# Patient Record
Sex: Male | Born: 1950 | Race: White | Hispanic: No | Smoking: Current every day smoker
Health system: Southern US, Community
[De-identification: ages and names within clinical notes are randomized; demographics above are authoritative.]

## PROBLEM LIST (undated history)

## (undated) DIAGNOSIS — M1990 Unspecified osteoarthritis, unspecified site: Secondary | ICD-10-CM

## (undated) DIAGNOSIS — S52501A Unspecified fracture of the lower end of right radius, initial encounter for closed fracture: Secondary | ICD-10-CM

## (undated) DIAGNOSIS — W3400XA Accidental discharge from unspecified firearms or gun, initial encounter: Secondary | ICD-10-CM

## (undated) DIAGNOSIS — E039 Hypothyroidism, unspecified: Secondary | ICD-10-CM

## (undated) DIAGNOSIS — C61 Malignant neoplasm of prostate: Secondary | ICD-10-CM

## (undated) DIAGNOSIS — H548 Legal blindness, as defined in USA: Secondary | ICD-10-CM

## (undated) DIAGNOSIS — Z972 Presence of dental prosthetic device (complete) (partial): Secondary | ICD-10-CM

## (undated) DIAGNOSIS — K4090 Unilateral inguinal hernia, without obstruction or gangrene, not specified as recurrent: Secondary | ICD-10-CM

## (undated) DIAGNOSIS — K08109 Complete loss of teeth, unspecified cause, unspecified class: Secondary | ICD-10-CM

## (undated) HISTORY — PX: PROSTATE BIOPSY: SHX241

## (undated) HISTORY — PX: EYE SURGERY: SHX253

## (undated) HISTORY — PX: OTHER SURGICAL HISTORY: SHX169

## (undated) HISTORY — PX: NO PAST SURGERIES: SHX2092

## (undated) HISTORY — DX: Hypothyroidism, unspecified: E03.9

## (undated) HISTORY — DX: Legal blindness, as defined in USA: H54.8

## (undated) HISTORY — PX: MULTIPLE TOOTH EXTRACTIONS: SHX2053

## (undated) HISTORY — DX: Unspecified osteoarthritis, unspecified site: M19.90

---

## 1978-07-07 DIAGNOSIS — H548 Legal blindness, as defined in USA: Secondary | ICD-10-CM

## 1978-07-07 HISTORY — DX: Legal blindness, as defined in USA: H54.8

## 2007-07-12 ENCOUNTER — Emergency Department (HOSPITAL_COMMUNITY): Admission: EM | Admit: 2007-07-12 | Discharge: 2007-07-12 | Payer: Self-pay | Admitting: Emergency Medicine

## 2011-09-04 ENCOUNTER — Telehealth: Payer: Self-pay | Admitting: Internal Medicine

## 2011-09-04 NOTE — Telephone Encounter (Signed)
Faith called and is hoping her husband can be seen as a new patient.  She states he is having trouble sleeping.  She hopes they can both come together on 3/14 (thats when her next apt is).  She is scheduled at 10am, so she is hoping he can be seen at 10:15am.  You already have one new patient scheduled for that morning.    Please advise if an exception can be made.  Thanks!

## 2011-09-05 NOTE — Telephone Encounter (Signed)
ok 

## 2011-09-18 ENCOUNTER — Encounter: Payer: Self-pay | Admitting: Internal Medicine

## 2011-09-18 ENCOUNTER — Ambulatory Visit (INDEPENDENT_AMBULATORY_CARE_PROVIDER_SITE_OTHER): Payer: Commercial Managed Care - PPO | Admitting: Internal Medicine

## 2011-09-18 ENCOUNTER — Other Ambulatory Visit (INDEPENDENT_AMBULATORY_CARE_PROVIDER_SITE_OTHER): Payer: Commercial Managed Care - PPO

## 2011-09-18 VITALS — BP 130/72 | HR 62 | Temp 97.4°F | Ht 67.0 in | Wt 141.2 lb

## 2011-09-18 DIAGNOSIS — Z0000 Encounter for general adult medical examination without abnormal findings: Secondary | ICD-10-CM

## 2011-09-18 DIAGNOSIS — G5760 Lesion of plantar nerve, unspecified lower limb: Secondary | ICD-10-CM

## 2011-09-18 DIAGNOSIS — Z1211 Encounter for screening for malignant neoplasm of colon: Secondary | ICD-10-CM

## 2011-09-18 DIAGNOSIS — E039 Hypothyroidism, unspecified: Secondary | ICD-10-CM

## 2011-09-18 LAB — URINALYSIS
Ketones, ur: NEGATIVE
Leukocytes, UA: NEGATIVE
Specific Gravity, Urine: 1.025 (ref 1.000–1.030)
Urobilinogen, UA: 0.2 (ref 0.0–1.0)

## 2011-09-18 LAB — CBC WITH DIFFERENTIAL/PLATELET
Basophils Absolute: 0.1 10*3/uL (ref 0.0–0.1)
Eosinophils Absolute: 0.4 10*3/uL (ref 0.0–0.7)
Lymphocytes Relative: 32.3 % (ref 12.0–46.0)
MCHC: 33.8 g/dL (ref 30.0–36.0)
Neutro Abs: 4.4 10*3/uL (ref 1.4–7.7)
Neutrophils Relative %: 52.1 % (ref 43.0–77.0)
RDW: 12.8 % (ref 11.5–14.6)

## 2011-09-18 LAB — LIPID PANEL
Total CHOL/HDL Ratio: 5
Triglycerides: 163 mg/dL — ABNORMAL HIGH (ref 0.0–149.0)

## 2011-09-18 LAB — HEPATIC FUNCTION PANEL
AST: 15 U/L (ref 0–37)
Alkaline Phosphatase: 71 U/L (ref 39–117)
Total Bilirubin: 0.3 mg/dL (ref 0.3–1.2)

## 2011-09-18 LAB — BASIC METABOLIC PANEL
CO2: 28 mEq/L (ref 19–32)
Calcium: 9.4 mg/dL (ref 8.4–10.5)
Creatinine, Ser: 0.9 mg/dL (ref 0.4–1.5)
Glucose, Bld: 93 mg/dL (ref 70–99)

## 2011-09-18 LAB — TSH: TSH: 7.16 u[IU]/mL — ABNORMAL HIGH (ref 0.35–5.50)

## 2011-09-18 NOTE — Patient Instructions (Signed)
It was good to see you today. We have reviewed your medical history today Test(s) ordered today. Your results will be called to you after review (48-72hours after test completion). If any changes need to be made, you will be notified at that time. Health Maintenance reviewed - immunizations declined, refer to colonoscopy. we'll make referral to GI for colonoscopy and to Dr Darrick Penna for your toe/foot pain. Our office will contact you regarding appointment(s) once made. Please schedule followup in 1 year for physical and review, call sooner if problems.
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
## 2011-09-19 ENCOUNTER — Encounter: Payer: Self-pay | Admitting: Internal Medicine

## 2011-09-19 DIAGNOSIS — E039 Hypothyroidism, unspecified: Secondary | ICD-10-CM | POA: Insufficient documentation

## 2011-09-19 MED ORDER — LEVOTHYROXINE SODIUM 50 MCG PO TABS: 50.0000 ug | ORAL_TABLET | Freq: Every day | ORAL | Status: DC

## 2011-09-19 NOTE — Assessment & Plan Note (Signed)
New dx on CPX labs 09/2011 Start synth qd and recheck in 3-6 mo Lab Results  Component Value Date   TSH 7.16* 09/18/2011

## 2011-09-24 ENCOUNTER — Ambulatory Visit: Payer: Commercial Managed Care - PPO | Admitting: Sports Medicine

## 2011-10-07 ENCOUNTER — Ambulatory Visit (INDEPENDENT_AMBULATORY_CARE_PROVIDER_SITE_OTHER): Payer: Commercial Managed Care - PPO | Admitting: Sports Medicine

## 2011-10-07 VITALS — BP 120/60 | Ht 67.0 in | Wt 141.0 lb

## 2011-10-07 DIAGNOSIS — M7750 Other enthesopathy of unspecified foot: Secondary | ICD-10-CM

## 2011-10-07 DIAGNOSIS — M7740 Metatarsalgia, unspecified foot: Secondary | ICD-10-CM | POA: Insufficient documentation

## 2011-10-07 NOTE — Patient Instructions (Signed)
It was great to see you today! If your pain is not better in 2-4 weeks, come back to see [place] again.

## 2011-10-07 NOTE — Progress Notes (Signed)
Patient ID: Tony Massey, male   DOB: 1950-09-26, 61 y.o.   MRN: [id number] Pt is a 61 year old male who presents today with several years worth of right forefoot pain and 3rd-5th right toe numbness with long standing.  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  He reports that his pain has increased over the last several months and he was referred to sports medicine after an initial appointment with his new PCP.  Pt is visually impaired and so is not able to comment on any swelling/erythema of the affected foot.  Objective: Gen: Pleasant gentleman, no distress Leg: Negative straight leg raise. Hand: Left hand dupuytren's contracture with 1st MCP arthritis, right hand with significant 1st-3rd MCP arthritis. Foot/ankle: Partial fusion of the 2nd and 3rd toe on the right with 4th hammer-toe on the right.  Partial fusion of the 2nd and 3rd toes on the left that is less severe than on the right.  Pt has significant right sided transverse arch collapse.  No pain/numbness with palpation between metatarsals on the dorsal side. Neg squeeze test Negative quarter test for neuroma  Assessment/Plan: 1) Metatarsalgia of the right foot.  Treat with metatarsal foot pad. 2) Dupuytren's contracture on the left, currently relatively asymptomatic.  Rec continued stretching to promote range of motion.

## 2011-10-07 NOTE — Assessment & Plan Note (Signed)
See assessment  We will try with MT pads only If this does not resolve sxs well we will consider additional orthotic support  If temp sports insoles and pads work well we will replace these in 4 mos

## 2011-10-15 ENCOUNTER — Telehealth: Payer: Self-pay

## 2011-10-15 NOTE — Telephone Encounter (Signed)
Pt's spouse advised 

## 2011-10-15 NOTE — Telephone Encounter (Signed)
Pt's spouse called stating pt has been experiencing a decrease in energy level. Spouse is requesting MD advisement on an OTC vitamin that may help

## 2011-10-15 NOTE — Telephone Encounter (Signed)
Any One a day mens formula type MVI may help - ROV if continued symptoms - thanks

## 2011-11-19 ENCOUNTER — Encounter: Payer: Self-pay | Admitting: Gastroenterology

## 2012-12-03 ENCOUNTER — Telehealth: Payer: Self-pay | Admitting: *Deleted

## 2012-12-03 NOTE — Telephone Encounter (Signed)
Left msg on vm husband has pull a muscle in his back. Requesting rx for tramadol to be call into his pharmacy,,,,lmb
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
## 2013-07-14 ENCOUNTER — Telehealth: Payer: Self-pay | Admitting: Internal Medicine

## 2013-07-14 NOTE — Telephone Encounter (Signed)
Pt wants to know if Dr. Asa Lente will fill out paperwork stating he is blind and needs to ride [employer].  He was last seen in March 2013.

## 2013-07-15 NOTE — Telephone Encounter (Signed)
LMOM to call back

## 2013-07-15 NOTE — Telephone Encounter (Signed)
Yes, I will complete Pt also needs OV scheduled for follow up/AWV thanks

## 2013-07-21 NOTE — Telephone Encounter (Signed)
lmom to call for an appt.

## 2013-08-03 NOTE — Telephone Encounter (Signed)
Pt did not return calls.

## 2015-11-21 ENCOUNTER — Emergency Department
Admission: EM | Admit: 2015-11-21 | Discharge: 2015-11-21 | Disposition: A | Discharge status: Home / Self Care | Payer: Commercial Managed Care - PPO | Attending: Emergency Medicine | Admitting: Emergency Medicine

## 2015-11-21 DIAGNOSIS — L03114 Cellulitis of left upper limb: Secondary | ICD-10-CM | POA: Insufficient documentation

## 2015-11-21 DIAGNOSIS — S50812A Abrasion of left forearm, initial encounter: Secondary | ICD-10-CM | POA: Diagnosis not present

## 2015-11-21 DIAGNOSIS — Y999 Unspecified external cause status: Secondary | ICD-10-CM | POA: Insufficient documentation

## 2015-11-21 DIAGNOSIS — R2232 Localized swelling, mass and lump, left upper limb: Secondary | ICD-10-CM | POA: Diagnosis present

## 2015-11-21 DIAGNOSIS — Y929 Unspecified place or not applicable: Secondary | ICD-10-CM | POA: Insufficient documentation

## 2015-11-21 DIAGNOSIS — Y939 Activity, unspecified: Secondary | ICD-10-CM | POA: Insufficient documentation

## 2015-11-21 DIAGNOSIS — Z23 Encounter for immunization: Secondary | ICD-10-CM | POA: Insufficient documentation

## 2015-11-21 DIAGNOSIS — E039 Hypothyroidism, unspecified: Secondary | ICD-10-CM | POA: Insufficient documentation

## 2015-11-21 DIAGNOSIS — J45909 Unspecified asthma, uncomplicated: Secondary | ICD-10-CM | POA: Insufficient documentation

## 2015-11-21 DIAGNOSIS — T148XXA Other injury of unspecified body region, initial encounter: Secondary | ICD-10-CM

## 2015-11-21 DIAGNOSIS — M1990 Unspecified osteoarthritis, unspecified site: Secondary | ICD-10-CM | POA: Insufficient documentation

## 2015-11-21 DIAGNOSIS — W278XXA Contact with other nonpowered hand tool, initial encounter: Secondary | ICD-10-CM | POA: Insufficient documentation

## 2015-11-21 DIAGNOSIS — F17210 Nicotine dependence, cigarettes, uncomplicated: Secondary | ICD-10-CM | POA: Diagnosis not present

## 2015-11-21 HISTORY — DX: Accidental discharge from unspecified firearms or gun, initial encounter: W34.00XA

## 2015-11-21 LAB — CBC WITH DIFFERENTIAL/PLATELET
BASOS PCT: 1 %
Basophils Absolute: 0.1 10*3/uL (ref 0–0.1)
EOS ABS: 0.4 10*3/uL (ref 0–0.7)
EOS PCT: 6 %
HCT: 46.6 % (ref 40.0–52.0)
Hemoglobin: 16.2 g/dL (ref 13.0–18.0)
LYMPHS ABS: 1.5 10*3/uL (ref 1.0–3.6)
Lymphocytes Relative: 21 %
MCH: 33 pg (ref 26.0–34.0)
MCHC: 34.7 g/dL (ref 32.0–36.0)
MCV: 95.1 fL (ref 80.0–100.0)
Monocytes Absolute: 0.5 10*3/uL (ref 0.2–1.0)
Monocytes Relative: 7 %
Neutro Abs: 4.8 10*3/uL (ref 1.4–6.5)
Neutrophils Relative %: 65 %
PLATELETS: 173 10*3/uL (ref 150–440)
RBC: 4.91 MIL/uL (ref 4.40–5.90)
RDW: 13.3 % (ref 11.5–14.5)
WBC: 7.4 10*3/uL (ref 3.8–10.6)

## 2015-11-21 LAB — COMPREHENSIVE METABOLIC PANEL
ALBUMIN: 4.7 g/dL (ref 3.5–5.0)
ALK PHOS: 75 U/L (ref 38–126)
ALT: 19 U/L (ref 17–63)
AST: 32 U/L (ref 15–41)
Anion gap: 9 (ref 5–15)
BUN: 5 mg/dL — AB (ref 6–20)
CHLORIDE: 98 mmol/L — AB (ref 101–111)
CO2: 27 mmol/L (ref 22–32)
CREATININE: 0.8 mg/dL (ref 0.61–1.24)
Calcium: 9.3 mg/dL (ref 8.9–10.3)
Glucose, Bld: 85 mg/dL (ref 65–99)
POTASSIUM: 4.8 mmol/L (ref 3.5–5.1)
SODIUM: 134 mmol/L — AB (ref 135–145)
TOTAL PROTEIN: 7.9 g/dL (ref 6.5–8.1)
Total Bilirubin: 0.7 mg/dL (ref 0.3–1.2)

## 2015-11-21 MED ORDER — SULFAMETHOXAZOLE-TRIMETHOPRIM 800-160 MG PO TABS: 2.0000 | ORAL_TABLET | Freq: Two times a day (BID) | ORAL | Status: DC

## 2015-11-21 MED ORDER — BACITRACIN ZINC 500 UNIT/GM EX OINT
TOPICAL_OINTMENT | CUTANEOUS | Status: AC
  Filled 2015-11-21: qty 0.9

## 2015-11-21 MED ORDER — BACITRACIN ZINC 500 UNIT/GM EX OINT
TOPICAL_OINTMENT | Freq: Two times a day (BID) | CUTANEOUS | Status: DC
  Administered 2015-11-21: 15:00:00 via TOPICAL

## 2015-11-21 MED ORDER — BACITRACIN ZINC 500 UNIT/GM EX OINT: TOPICAL_OINTMENT | CUTANEOUS | Status: DC

## 2015-11-21 MED ORDER — VANCOMYCIN HCL 10 G IV SOLR: 20.0000 mg/kg | Freq: Once | INTRAVENOUS | Status: DC

## 2015-11-21 MED ORDER — TETANUS-DIPHTH-ACELL PERTUSSIS 5-2.5-18.5 LF-MCG/0.5 IM SUSP
0.5000 mL | Freq: Once | INTRAMUSCULAR | Status: AC
  Administered 2015-11-21: 0.5 mL via INTRAMUSCULAR
  Filled 2015-11-21: qty 0.5

## 2015-11-21 MED ORDER — CEPHALEXIN 500 MG PO CAPS: 500.0000 mg | ORAL_CAPSULE | Freq: Four times a day (QID) | ORAL | Status: AC

## 2015-11-21 MED ORDER — VANCOMYCIN HCL 10 G IV SOLR
1250.0000 mg | Freq: Once | INTRAVENOUS | Status: AC
  Administered 2015-11-21: 1250 mg via INTRAVENOUS
  Filled 2015-11-21: qty 1250

## 2015-11-21 NOTE — Discharge Instructions (Signed)
Return immediately for any worsening or concerning symptoms. This includes but is not limited to fever, worsening of the redness or swelling. Also, if the symptoms are not resolving within the next 24-48 hours he will also need to return for likely treatment with IV antibiotics. Cellulitis Cellulitis is an infection of the skin and the tissue under the skin. The infected area is usually red and tender. This happens most often in the arms and lower legs. HOME CARE   Take your antibiotic medicine as told. Finish the medicine even if you start to feel better.  Keep the infected arm or leg raised (elevated).  Put a warm cloth on the area up to 4 times per day.  Only take medicines as told by your doctor.  Keep all doctor visits as told. GET HELP IF:  You see red streaks on the skin coming from the infected area.  Your red area gets bigger or turns a dark color.  Your bone or joint under the infected area is painful after the skin heals.  Your infection comes back in the same area or different area.  You have a puffy (swollen) bump in the infected area.  You have new symptoms.  You have a fever. GET HELP RIGHT AWAY IF:   You feel very sleepy.  You throw up (vomit) or have watery poop (diarrhea).  You feel sick and have muscle aches and pains.   This information is not intended to replace advice given to you by your health care provider. Make sure you discuss any questions you have with your health care provider.   Document Released: 12/10/2007 Document Revised: 03/14/2015 Document Reviewed: 09/08/2011 Elsevier Interactive Patient Education [employer].

## 2015-11-21 NOTE — ED Notes (Signed)
Abrasion noted to left anterior forearm. Small amount clear drainage noted from wound. Surrounding skin red. Left hand swollen and pt states feels tight.
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
## 2015-11-21 NOTE — ED Notes (Signed)
Pt states he scrapped his left FA last week working, states since Saturday he has had swelling, redness of the FA and hand.[name] Kitchen

## 2019-09-07 ENCOUNTER — Other Ambulatory Visit (HOSPITAL_COMMUNITY): Payer: Self-pay | Admitting: Urology

## 2019-09-07 DIAGNOSIS — C61 Malignant neoplasm of prostate: Secondary | ICD-10-CM

## 2019-09-21 ENCOUNTER — Encounter (HOSPITAL_COMMUNITY)
Admission: RE | Admit: 2019-09-21 | Discharge: 2019-09-21 | Disposition: A | Discharge status: Home / Self Care | Payer: Medicare Other | Source: Ambulatory Visit | Attending: Urology | Admitting: Urology

## 2019-09-21 ENCOUNTER — Other Ambulatory Visit: Payer: Self-pay

## 2019-09-21 DIAGNOSIS — C61 Malignant neoplasm of prostate: Secondary | ICD-10-CM | POA: Diagnosis present

## 2019-09-21 MED ORDER — TECHNETIUM TC 99M MEDRONATE IV KIT
21.5000 | PACK | Freq: Once | INTRAVENOUS | Status: AC | PRN
  Administered 2019-09-21: 21.5 via INTRAVENOUS

## 2019-09-22 ENCOUNTER — Encounter: Payer: Self-pay | Admitting: Radiation Oncology
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
## 2019-09-23 ENCOUNTER — Ambulatory Visit
Admission: RE | Admit: 2019-09-23 | Discharge: 2019-09-23 | Disposition: A | Discharge status: Home / Self Care | Payer: Commercial Managed Care - PPO | Source: Ambulatory Visit | Attending: Radiation Oncology | Admitting: Radiation Oncology

## 2019-09-23 ENCOUNTER — Encounter: Payer: Self-pay | Admitting: Urology

## 2019-09-23 ENCOUNTER — Other Ambulatory Visit: Payer: Self-pay

## 2019-09-23 VITALS — Ht 67.5 in | Wt 128.0 lb

## 2019-09-23 DIAGNOSIS — C61 Malignant neoplasm of prostate: Secondary | ICD-10-CM

## 2019-09-23 HISTORY — DX: Malignant neoplasm of prostate: C61

## 2019-09-23 NOTE — Progress Notes (Signed)
See progress note under physician encounter. 
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
## 2019-09-26 ENCOUNTER — Telehealth: Payer: Self-pay | Admitting: Medical Oncology

## 2019-09-26 NOTE — Telephone Encounter (Signed)
Left message to introduce myself as the prostate nurse navigator and discuss my role. I was unable to meet him during consult 3/19, with Dr. Tammi Klippel. He has chosen ADT and seed implant as treatment. He has a follow up appointment with Dr. Tresa Moore 4/5. I gave him my contact information and asked him to call me with questions or concerns.

## 2019-10-11 ENCOUNTER — Encounter: Payer: Self-pay | Admitting: *Deleted

## 2019-10-11 ENCOUNTER — Encounter: Payer: Self-pay | Admitting: Medical Oncology

## 2019-10-14 ENCOUNTER — Encounter: Payer: Self-pay | Admitting: Urology

## 2019-10-14 NOTE — Progress Notes (Signed)
Patient started Casodex ADT at the time of his f/u visit with Dr. Tresa Moore on 10/10/19 and is scheduled for a 6 month Eligard injection on 10/26/19. I have informed [facility] and requested to move forward with scheduling brachytherapy/SpaceOAR with Dr. Tresa Moore in 12/2019.  Nicholos Johns, MMS, PA-C [facility] [place]: [phone]  Fax: [phone]

## 2019-10-18 ENCOUNTER — Telehealth: Payer: Self-pay | Admitting: *Deleted

## 2019-10-18 NOTE — Telephone Encounter (Signed)
CALLED PATIENT TO ASK QUESTIONS, LVM FOR A RETURN CALL 

## 2019-10-19 ENCOUNTER — Other Ambulatory Visit: Payer: Self-pay | Admitting: Urology

## 2019-10-21 ENCOUNTER — Telehealth: Payer: Self-pay | Admitting: *Deleted

## 2019-10-21 NOTE — Telephone Encounter (Signed)
CALLED PATIENT TO INFORM OF PRE-SEED APPTS. FOR 11-10-19 AND HIS IMPLANT FOR 12-23-19, SPOKE WITH PATIENT AND HE IS AWARE OF THESE APPTS.

## 2019-11-09 ENCOUNTER — Telehealth: Payer: Self-pay | Admitting: *Deleted

## 2019-11-09 NOTE — Telephone Encounter (Signed)
CALLED PATIENT'S DAUGHTER, STALENA HAYDEN TO REMIND OF PRE-SEED APPTS. FOR 11-10-19, SPOKE WITH PATIENT'S DAUGHTER AND SHE IS AWARE OF THESE APPTS.

## 2019-11-09 NOTE — Telephone Encounter (Signed)
XXXX 

## 2019-11-10 ENCOUNTER — Other Ambulatory Visit: Payer: Self-pay

## 2019-11-10 ENCOUNTER — Ambulatory Visit (HOSPITAL_COMMUNITY)
Admission: RE | Admit: 2019-11-10 | Discharge: 2019-11-10 | Disposition: A | Discharge status: Home / Self Care | Payer: Medicare Other | Source: Ambulatory Visit | Attending: Urology | Admitting: Urology

## 2019-11-10 ENCOUNTER — Encounter: Payer: Self-pay | Admitting: Medical Oncology

## 2019-11-10 ENCOUNTER — Ambulatory Visit
Admission: RE | Admit: 2019-11-10 | Discharge: 2019-11-10 | Disposition: A | Discharge status: Home / Self Care | Payer: Medicare Other | Source: Ambulatory Visit | Attending: Radiation Oncology | Admitting: Radiation Oncology

## 2019-11-10 ENCOUNTER — Encounter (HOSPITAL_COMMUNITY)
Admission: RE | Admit: 2019-11-10 | Discharge: 2019-11-10 | Disposition: A | Discharge status: Home / Self Care | Payer: Medicare Other | Source: Ambulatory Visit | Attending: Urology | Admitting: Urology

## 2019-11-10 ENCOUNTER — Ambulatory Visit
Admission: RE | Admit: 2019-11-10 | Discharge: 2019-11-10 | Disposition: A | Discharge status: Home / Self Care | Payer: Medicare Other | Source: Ambulatory Visit | Attending: Urology | Admitting: Urology

## 2019-11-10 DIAGNOSIS — Z01818 Encounter for other preprocedural examination: Secondary | ICD-10-CM | POA: Insufficient documentation

## 2019-11-10 DIAGNOSIS — C61 Malignant neoplasm of prostate: Secondary | ICD-10-CM

## 2019-11-10 NOTE — Progress Notes (Signed)
Met Tony Massey and his daughter, Tony Massey during CT simulation. I  introduced myself as the prostate nurse navigator and discussed my role. Tony Massey is very involved in her father's care and all information was discussed and reviewed with her. He is scheduled for brachytherapy 6/18.

## 2019-11-10 NOTE — Progress Notes (Signed)
  Radiation Oncology         [phone]) [phone] ________________________________  Name: Tony Massey MRN: [id number]  Date: 11/10/2019  DOB: May 23, 1951  SIMULATION AND TREATMENT PLANNING NOTE PUBIC ARCH STUDY  [id number], Terrilee Files, FNP  [name], MD  DIAGNOSIS: 69 y.o. 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 Oncology History Overview Note  69 y.o. 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 Malignant neoplasm of prostate ([place])  C61     COMPLEX SIMULATION:  The patient presented today for evaluation for possible prostate seed implant. He was brought to the radiation planning suite and placed supine on the CT couch. A 3-dimensional image study set was obtained in upload to the planning computer. There, on each axial slice, I contoured the prostate gland. Then, using three-dimensional radiation planning tools I reconstructed the prostate in view of the structures from the transperineal needle pathway to assess for possible pubic arch interference. In doing so, I did not appreciate any pubic arch interference. Also, the patient's prostate volume was estimated based on the drawn structure. The volume was 26 cc.  Given the pubic arch appearance and prostate volume, patient remains a good candidate to proceed with prostate seed implant. Today, he freely provided informed written consent to proceed.    PLAN: The patient will undergo prostate seed implant.   ________________________________  Sheral Apley. Tammi Klippel, M.D.

## 2019-12-08 ENCOUNTER — Encounter: Payer: Self-pay | Admitting: Urology
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
## 2019-12-19 ENCOUNTER — Encounter (HOSPITAL_BASED_OUTPATIENT_CLINIC_OR_DEPARTMENT_OTHER): Payer: Self-pay | Admitting: Urology

## 2019-12-19 ENCOUNTER — Telehealth: Payer: Self-pay | Admitting: *Deleted

## 2019-12-19 ENCOUNTER — Other Ambulatory Visit: Payer: Self-pay

## 2019-12-19 NOTE — Progress Notes (Signed)
Spoke w/ via phone for pre-op interview---patient daughter Tony Massey per pt request Lab needs dos----none              Lab results------ekg 11-10-2019 epic, chest xray 12-10-2019 epic has lab appt 12-20-2019 @100  pm for cbc, cmet, pt, ptt COVID test ------12-20-2019 at 1400 Arrive at -------730 am 12-23-2019 NPO after ------midnight Medications to take morning of surgery -----euthyrox with sip of water Diabetic medication -----n/a Patient Special Instructions -----fleets enema am of surgery Pre-Op special Istructions -----none Patient verbalized understanding of instructions that were given at this phone interview. Patient denies shortness of breath, chest pain, fever, cough a this phone interview.

## 2019-12-19 NOTE — Telephone Encounter (Signed)
Called Doylene Bode to inform of patient's lab and Covid testing for 12-20-19, vm full, unable to leave message.

## 2019-12-20 ENCOUNTER — Encounter (HOSPITAL_COMMUNITY)
Admission: RE | Admit: 2019-12-20 | Discharge: 2019-12-20 | Disposition: A | Discharge status: Home / Self Care | Payer: Medicare Other | Source: Ambulatory Visit | Attending: Urology | Admitting: Urology

## 2019-12-20 ENCOUNTER — Other Ambulatory Visit (HOSPITAL_COMMUNITY)
Admission: RE | Admit: 2019-12-20 | Discharge: 2019-12-20 | Disposition: A | Discharge status: Home / Self Care | Payer: Medicare Other | Source: Ambulatory Visit | Attending: Urology | Admitting: Urology

## 2019-12-20 DIAGNOSIS — Z01812 Encounter for preprocedural laboratory examination: Secondary | ICD-10-CM | POA: Diagnosis not present

## 2019-12-20 DIAGNOSIS — Z20822 Contact with and (suspected) exposure to covid-19: Secondary | ICD-10-CM | POA: Diagnosis not present

## 2019-12-20 LAB — COMPREHENSIVE METABOLIC PANEL
ALT: 13 U/L (ref 0–44)
AST: 20 U/L (ref 15–41)
Albumin: 4.1 g/dL (ref 3.5–5.0)
Alkaline Phosphatase: 70 U/L (ref 38–126)
Anion gap: 8 (ref 5–15)
BUN: <5 mg/dL — ABNORMAL LOW (ref 8–23)
CO2: 25 mmol/L (ref 22–32)
Calcium: 9 mg/dL (ref 8.9–10.3)
Chloride: 95 mmol/L — ABNORMAL LOW (ref 98–111)
Creatinine, Ser: 0.67 mg/dL (ref 0.61–1.24)
GFR calc Af Amer: >60 mL/min (ref >60)
GFR calc non Af Amer: >60 mL/min (ref >60)
Glucose, Bld: 83 mg/dL (ref 70–99)
Potassium: 4.4 mmol/L (ref 3.5–5.1)
Sodium: 128 mmol/L — ABNORMAL LOW (ref 135–145)
Total Bilirubin: 0.6 mg/dL (ref 0.3–1.2)
Total Protein: 7.2 g/dL (ref 6.5–8.1)

## 2019-12-20 LAB — CBC
HCT: 33.8 % — ABNORMAL LOW (ref 39.0–52.0)
Hemoglobin: 12 g/dL — ABNORMAL LOW (ref 13.0–17.0)
MCH: 35.1 pg — ABNORMAL HIGH (ref 26.0–34.0)
MCHC: 35.5 g/dL (ref 30.0–36.0)
MCV: 98.8 fL (ref 80.0–100.0)
Platelets: 192 10*3/uL (ref 150–400)
RBC: 3.42 MIL/uL — ABNORMAL LOW (ref 4.22–5.81)
RDW: 12.5 % (ref 11.5–15.5)
WBC: 9.3 10*3/uL (ref 4.0–10.5)
nRBC: 0 % (ref 0.0–0.2)

## 2019-12-20 LAB — PROTIME-INR
INR: 1 (ref 0.8–1.2)
Prothrombin Time: 12.7 seconds (ref 11.4–15.2)

## 2019-12-20 LAB — APTT: aPTT: 32 seconds (ref 24–36)

## 2019-12-20 LAB — SARS CORONAVIRUS 2 (TAT 6-24 HRS): SARS Coronavirus 2: NEGATIVE

## 2019-12-22 ENCOUNTER — Telehealth: Payer: Self-pay | Admitting: *Deleted

## 2019-12-22 NOTE — Telephone Encounter (Signed)
CALLED PATIENT TO REMIND OF PROCEDURE FOR 12-23-19, SPOKE WITH Tony Massey AND SHE IS AWARE OF THIS PROCEDURE

## 2019-12-23 ENCOUNTER — Ambulatory Visit (HOSPITAL_BASED_OUTPATIENT_CLINIC_OR_DEPARTMENT_OTHER): Payer: Medicare Other | Admitting: Anesthesiology

## 2019-12-23 ENCOUNTER — Ambulatory Visit (HOSPITAL_BASED_OUTPATIENT_CLINIC_OR_DEPARTMENT_OTHER)
Admission: RE | Admit: 2019-12-23 | Discharge: 2019-12-23 | Disposition: A | Discharge status: Home / Self Care | Payer: Medicare Other | Attending: Urology | Admitting: Urology

## 2019-12-23 ENCOUNTER — Ambulatory Visit (HOSPITAL_COMMUNITY): Payer: Medicare Other

## 2019-12-23 ENCOUNTER — Encounter (HOSPITAL_BASED_OUTPATIENT_CLINIC_OR_DEPARTMENT_OTHER)
Admission: RE | Disposition: A | Discharge status: Home / Self Care | Payer: Self-pay | Source: Home / Self Care | Attending: Urology

## 2019-12-23 ENCOUNTER — Encounter (HOSPITAL_BASED_OUTPATIENT_CLINIC_OR_DEPARTMENT_OTHER): Payer: Self-pay | Admitting: Urology

## 2019-12-23 DIAGNOSIS — H548 Legal blindness, as defined in USA: Secondary | ICD-10-CM | POA: Diagnosis not present

## 2019-12-23 DIAGNOSIS — Z8709 Personal history of other diseases of the respiratory system: Secondary | ICD-10-CM | POA: Diagnosis not present

## 2019-12-23 DIAGNOSIS — S62101A Fracture of unspecified carpal bone, right wrist, initial encounter for closed fracture: Secondary | ICD-10-CM | POA: Insufficient documentation

## 2019-12-23 DIAGNOSIS — M1990 Unspecified osteoarthritis, unspecified site: Secondary | ICD-10-CM | POA: Diagnosis not present

## 2019-12-23 DIAGNOSIS — F17210 Nicotine dependence, cigarettes, uncomplicated: Secondary | ICD-10-CM | POA: Diagnosis not present

## 2019-12-23 DIAGNOSIS — C61 Malignant neoplasm of prostate: Secondary | ICD-10-CM | POA: Insufficient documentation

## 2019-12-23 DIAGNOSIS — W19XXXA Unspecified fall, initial encounter: Secondary | ICD-10-CM | POA: Diagnosis not present

## 2019-12-23 HISTORY — PX: CYSTOSCOPY: SHX5120

## 2019-12-23 HISTORY — PX: RADIOACTIVE SEED IMPLANT: SHX5150

## 2019-12-23 HISTORY — PX: SPACE OAR INSTILLATION: SHX6769

## 2019-12-23 LAB — POCT I-STAT, CHEM 8
BUN: 3 mg/dL — ABNORMAL LOW (ref 8–23)
Calcium, Ion: 1.18 mmol/L (ref 1.15–1.40)
Chloride: 103 mmol/L (ref 98–111)
Creatinine, Ser: 0.7 mg/dL (ref 0.61–1.24)
Glucose, Bld: 86 mg/dL (ref 70–99)
HCT: 33 % — ABNORMAL LOW (ref 39.0–52.0)
Hemoglobin: 11.2 g/dL — ABNORMAL LOW (ref 13.0–17.0)
Potassium: 4 mmol/L (ref 3.5–5.1)
Sodium: 135 mmol/L (ref 135–145)
TCO2: 21 mmol/L — ABNORMAL LOW (ref 22–32)

## 2019-12-23 SURGERY — INSERTION, RADIATION SOURCE, PROSTATE
Anesthesia: General | Site: Rectum

## 2019-12-23 MED ORDER — LIDOCAINE 2% (20 MG/ML) 5 ML SYRINGE
INTRAMUSCULAR | Status: AC
  Filled 2019-12-23: qty 5

## 2019-12-23 MED ORDER — FENTANYL CITRATE (PF) 100 MCG/2ML IJ SOLN: 25.0000 ug | INTRAMUSCULAR | Status: DC | PRN

## 2019-12-23 MED ORDER — PROPOFOL 10 MG/ML IV BOLUS
INTRAVENOUS | Status: AC
  Filled 2019-12-23: qty 20

## 2019-12-23 MED ORDER — ALBUMIN HUMAN 5 % IV SOLN: INTRAVENOUS | Status: DC | PRN

## 2019-12-23 MED ORDER — DEXAMETHASONE SODIUM PHOSPHATE 10 MG/ML IJ SOLN
INTRAMUSCULAR | Status: AC
  Filled 2019-12-23: qty 1

## 2019-12-23 MED ORDER — PHENYLEPHRINE 40 MCG/ML (10ML) SYRINGE FOR IV PUSH (FOR BLOOD PRESSURE SUPPORT)
PREFILLED_SYRINGE | INTRAVENOUS | Status: AC
  Filled 2019-12-23: qty 10

## 2019-12-23 MED ORDER — ONDANSETRON HCL 4 MG/2ML IJ SOLN
INTRAMUSCULAR | Status: AC
  Filled 2019-12-23: qty 2

## 2019-12-23 MED ORDER — ONDANSETRON HCL 4 MG/2ML IJ SOLN
INTRAMUSCULAR | Status: DC | PRN
  Administered 2019-12-23: 4 mg via INTRAVENOUS

## 2019-12-23 MED ORDER — SODIUM CHLORIDE 0.9 % IR SOLN
Status: DC | PRN
  Administered 2019-12-23: 1000 mL via INTRAVESICAL

## 2019-12-23 MED ORDER — EPHEDRINE 5 MG/ML INJ
INTRAVENOUS | Status: AC
  Filled 2019-12-23: qty 10

## 2019-12-23 MED ORDER — FLEET ENEMA 7-19 GM/118ML RE ENEM: 1.0000 | ENEMA | Freq: Once | RECTAL | Status: DC

## 2019-12-23 MED ORDER — GENTAMICIN SULFATE 40 MG/ML IJ SOLN
5.0000 mg/kg | Freq: Once | INTRAVENOUS | Status: AC
  Administered 2019-12-23: 330 mg via INTRAVENOUS
  Filled 2019-12-23: qty 8.25

## 2019-12-23 MED ORDER — LIDOCAINE 2% (20 MG/ML) 5 ML SYRINGE
INTRAMUSCULAR | Status: DC | PRN
  Administered 2019-12-23: 100 mg via INTRAVENOUS

## 2019-12-23 MED ORDER — TAMSULOSIN HCL 0.4 MG PO CAPS: 0.4000 mg | ORAL_CAPSULE | Freq: Every day | ORAL | 11 refills | Status: DC | PRN

## 2019-12-23 MED ORDER — DEXAMETHASONE SODIUM PHOSPHATE 4 MG/ML IJ SOLN
INTRAMUSCULAR | Status: DC | PRN
  Administered 2019-12-23: 10 mg via INTRAVENOUS

## 2019-12-23 MED ORDER — PHENYLEPHRINE 40 MCG/ML (10ML) SYRINGE FOR IV PUSH (FOR BLOOD PRESSURE SUPPORT)
PREFILLED_SYRINGE | INTRAVENOUS | Status: DC | PRN
  Administered 2019-12-23: 120 ug via INTRAVENOUS
  Administered 2019-12-23 (×2): 80 ug via INTRAVENOUS

## 2019-12-23 MED ORDER — PROPOFOL 10 MG/ML IV BOLUS
INTRAVENOUS | Status: DC | PRN
  Administered 2019-12-23: 20 mg via INTRAVENOUS
  Administered 2019-12-23: 150 mg via INTRAVENOUS
  Administered 2019-12-23 (×2): 20 mg via INTRAVENOUS
  Administered 2019-12-23: 25 mg via INTRAVENOUS
  Administered 2019-12-23 (×2): 20 mg via INTRAVENOUS

## 2019-12-23 MED ORDER — TRAMADOL HCL 50 MG PO TABS: 50.0000 mg | ORAL_TABLET | Freq: Four times a day (QID) | ORAL | 0 refills | Status: DC | PRN

## 2019-12-23 MED ORDER — FENTANYL CITRATE (PF) 100 MCG/2ML IJ SOLN
INTRAMUSCULAR | Status: DC | PRN
  Administered 2019-12-23 (×2): 50 ug via INTRAVENOUS

## 2019-12-23 MED ORDER — LACTATED RINGERS IV SOLN: INTRAVENOUS | Status: DC

## 2019-12-23 MED ORDER — FENTANYL CITRATE (PF) 100 MCG/2ML IJ SOLN
INTRAMUSCULAR | Status: AC
  Filled 2019-12-23: qty 2

## 2019-12-23 MED ORDER — SODIUM CHLORIDE FLUSH 0.9 % IV SOLN
INTRAVENOUS | Status: DC | PRN
  Administered 2019-12-23: 10 mL via INTRAVENOUS

## 2019-12-23 MED ORDER — EPHEDRINE SULFATE-NACL 50-0.9 MG/10ML-% IV SOSY
PREFILLED_SYRINGE | INTRAVENOUS | Status: DC | PRN
  Administered 2019-12-23 (×2): 10 mg via INTRAVENOUS

## 2019-12-23 MED ORDER — IOHEXOL 300 MG/ML  SOLN
INTRAMUSCULAR | Status: DC | PRN
  Administered 2019-12-23: 7 mL

## 2019-12-23 SURGICAL SUPPLY — 40 items
BAG DRN RND TRDRP ANRFLXCHMBR (UROLOGICAL SUPPLIES) ×3
BAG URINE DRAIN 2000ML AR STRL (UROLOGICAL SUPPLIES) ×4 IMPLANT
BLADE CLIPPER SENSICLIP SURGIC (BLADE) ×4 IMPLANT
BNDG ELASTIC 3X5.8 VLCR STR LF (GAUZE/BANDAGES/DRESSINGS) ×4 IMPLANT
CATH FOLEY 2WAY SLVR  5CC 16FR (CATHETERS) ×4
CATH FOLEY 2WAY SLVR 5CC 16FR (CATHETERS) ×3 IMPLANT
CATH ROBINSON RED A/P 16FR (CATHETERS) IMPLANT
CATH ROBINSON RED A/P 20FR (CATHETERS) ×4 IMPLANT
CLOTH BEACON ORANGE TIMEOUT ST (SAFETY) ×4 IMPLANT
CNTNR URN SCR LID CUP LEK RST (MISCELLANEOUS) ×6 IMPLANT
CONT SPEC 4OZ STRL OR WHT (MISCELLANEOUS) ×8
COVER BACK TABLE 60X90IN (DRAPES) ×4 IMPLANT
COVER MAYO STAND STRL (DRAPES) ×4 IMPLANT
DRSG TEGADERM 4X4.75 (GAUZE/BANDAGES/DRESSINGS) ×4 IMPLANT
DRSG TEGADERM 8X12 (GAUZE/BANDAGES/DRESSINGS) ×4 IMPLANT
GAUZE SPONGE 4X4 12PLY STRL (GAUZE/BANDAGES/DRESSINGS) ×4 IMPLANT
GLOVE BIO SURGEON STRL SZ 6 (GLOVE) IMPLANT
GLOVE BIO SURGEON STRL SZ7.5 (GLOVE) ×4 IMPLANT
GLOVE BIO SURGEON STRL SZ8 (GLOVE) IMPLANT
GLOVE SURG ORTHO 8.5 STRL (GLOVE) ×4 IMPLANT
GLOVE SURG SS PI 6.5 STRL IVOR (GLOVE) ×4 IMPLANT
GOWN STRL REUS W/TWL LRG LVL3 (GOWN DISPOSABLE) ×12 IMPLANT
GOWN STRL REUS W/TWL XL LVL3 (GOWN DISPOSABLE) ×4 IMPLANT
HOLDER FOLEY CATH W/STRAP (MISCELLANEOUS) IMPLANT
I-SEED AGX100 ×216 IMPLANT
IMPL SPACEOAR VUE SYSTEM (Spacer) ×3 IMPLANT
IMPLANT SPACEOAR VUE SYSTEM (Spacer) ×4 IMPLANT
IV NS 1000ML (IV SOLUTION) ×4
IV NS 1000ML BAXH (IV SOLUTION) ×3 IMPLANT
KIT TURNOVER CYSTO (KITS) ×4 IMPLANT
MARKER SKIN DUAL TIP RULER LAB (MISCELLANEOUS) ×4 IMPLANT
PAD CAST 3X4 CTTN HI CHSV (CAST SUPPLIES) ×3 IMPLANT
PADDING CAST COTTON 3X4 STRL (CAST SUPPLIES) ×4
SURGILUBE 2OZ TUBE FLIPTOP (MISCELLANEOUS) ×4 IMPLANT
SUT BONE WAX W31G (SUTURE) IMPLANT
SYR 10ML LL (SYRINGE) ×4 IMPLANT
TOWEL OR 17X26 10 PK STRL BLUE (TOWEL DISPOSABLE) ×4 IMPLANT
TRAY CYSTO PACK (CUSTOM PROCEDURE TRAY) ×4 IMPLANT
UNDERPAD 30X30 (UNDERPADS AND DIAPERS) ×8 IMPLANT
WATER STERILE IRR 500ML POUR (IV SOLUTION) ×4 IMPLANT

## 2019-12-23 NOTE — Anesthesia Postprocedure Evaluation (Signed)
Anesthesia Post Note  Patient: Tony Massey  Procedure(s) Performed: RADIOACTIVE SEED IMPLANT/BRACHYTHERAPY IMPLANT (N/A Prostate) SPACE OAR INSTILLATION (N/A Rectum) CYSTOSCOPY FLEXIBLE (N/A Bladder)     Patient location during evaluation: PACU Anesthesia Type: General Level of consciousness: awake Pain management: pain level controlled Vital Signs Assessment: post-procedure vital signs reviewed and stable Respiratory status: spontaneous breathing Cardiovascular status: stable Postop Assessment: no apparent nausea or vomiting Anesthetic complications: no   No complications documented.  Last Vitals:  Vitals:   12/23/19 1130 12/23/19 1145  BP: 129/72 118/67  Pulse: 77 85  Resp: 12 15  Temp:    SpO2: 99% 98%    Last Pain:  Vitals:   12/23/19 1230  TempSrc:   PainSc: 0-No pain                 Sherline Eberwein
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
## 2019-12-23 NOTE — Progress Notes (Signed)
Orthopedic Tech Progress Note Patient Details:  Tony Massey [birth year] [id number]  Ortho Devices Type of Ortho Device: Wrist splint Ortho Device/Splint Location: right       [name] 12/23/2019, 10:51 AM

## 2019-12-23 NOTE — Anesthesia Procedure Notes (Signed)
Procedure Name: LMA Insertion Date/Time: 12/23/2019 9:53 AM Performed by: Lieutenant Diego, CRNA Pre-anesthesia Checklist: Patient identified, Emergency Drugs available, Suction available and Patient being monitored Patient Re-evaluated:Patient Re-evaluated prior to induction Oxygen Delivery Method: Circle system utilized Preoxygenation: Pre-oxygenation with 100% oxygen Induction Type: IV induction Ventilation: Mask ventilation without difficulty LMA: LMA inserted LMA Size: 4.0 Number of attempts: 1 Placement Confirmation: positive ETCO2 and breath sounds checked- equal and bilateral Tube secured with: Tape Dental Injury: Teeth and Oropharynx as per pre-operative assessment

## 2019-12-23 NOTE — Anesthesia Preprocedure Evaluation (Signed)
Anesthesia Evaluation  Patient identified by MRN, date of birth, ID bandGeneral Assessment Comment:History noted. CG  Reviewed: Allergy & Precautions, NPO status , Patient's Chart, lab work & pertinent test results  Airway Mallampati: II  TM Distance: >3 FB     Dental   Pulmonary asthma , Current Smoker and Patient abstained from smoking.,    breath sounds clear to auscultation       Cardiovascular negative cardio ROS   Rhythm:Regular Rate:Normal     Neuro/Psych    GI/Hepatic negative GI ROS, Neg liver ROS,   Endo/Other  Hypothyroidism   Renal/GU negative Renal ROS     Musculoskeletal  (+) Arthritis ,   Abdominal   Peds  Hematology   Anesthesia Other Findings   Reproductive/Obstetrics                             Anesthesia Physical Anesthesia Plan  ASA: III  Anesthesia Plan: General   Post-op Pain Management:    Induction: Intravenous  PONV Risk Score and Plan: 2 and Ondansetron, Dexamethasone and Midazolam  Airway Management Planned: Oral ETT  Additional Equipment:   Intra-op Plan:   Post-operative Plan: Extubation in OR  Informed Consent:     Dental advisory given  Plan Discussed with: CRNA and Anesthesiologist  Anesthesia Plan Comments:         Anesthesia Quick Evaluation

## 2019-12-23 NOTE — Brief Op Note (Signed)
12/23/2019  10:47 AM  PATIENT:  Tony Massey  69 y.o. male  PRE-OPERATIVE DIAGNOSIS:  PROSTATE CANCER  POST-OPERATIVE DIAGNOSIS:  PROSTATE CANCER  PROCEDURE:  Procedure(s) with comments: RADIOACTIVE SEED IMPLANT/BRACHYTHERAPY IMPLANT (N/A) -   54  SEEDS IMPLANTED SPACE OAR INSTILLATION (N/A) CYSTOSCOPY FLEXIBLE (N/A)  SURGEON:  Surgeon(s) and Role:    * [name], MD - Primary    * Tyler Pita, MD  PHYSICIAN ASSISTANT:   ASSISTANTS: none   ANESTHESIA:   general  EBL:  5 mL   BLOOD ADMINISTERED:none  DRAINS: none   LOCAL MEDICATIONS USED:  NONE  SPECIMEN:  No Specimen  DISPOSITION OF SPECIMEN:  N/A  COUNTS:  YES  TOURNIQUET:  * No tourniquets in log *  DICTATION: .Other Dictation: Dictation Number [id number]  PLAN OF CARE: Discharge to home after PACU  PATIENT DISPOSITION:  PACU - hemodynamically stable.   Delay start of Pharmacological VTE agent (>24hrs) due to surgical blood loss or risk of bleeding: yes

## 2019-12-23 NOTE — Transfer of Care (Signed)
Immediate Anesthesia Transfer of Care Note  Patient: Tony Massey  Procedure(s) Performed: RADIOACTIVE SEED IMPLANT/BRACHYTHERAPY IMPLANT (N/A Prostate) SPACE OAR INSTILLATION (N/A Rectum) CYSTOSCOPY FLEXIBLE (N/A Bladder)  Patient Location: PACU  Anesthesia Type:General  Level of Consciousness: awake  Airway & Oxygen Therapy: Patient Spontanous Breathing and Patient connected to face mask oxygen  Post-op Assessment: Report given to RN and Post -op Vital signs reviewed and stable  Post vital signs: Reviewed and stable  Last Vitals:  Vitals Value Taken Time  BP 119/64 12/23/19 1055  Temp    Pulse 94 12/23/19 1057  Resp 16 12/23/19 1057  SpO2 100 % 12/23/19 1057  Vitals shown include unvalidated device data.  Last Pain:  Vitals:   12/23/19 0753  TempSrc: Oral  PainSc: 0-No pain         Complications: No complications documented.

## 2019-12-23 NOTE — Op Note (Signed)
NAME: [place], [place] [id number] ACCOUNT [id number] DATE OF BIRTH:05-17-1951 FACILITY: [facility] LOCATION: [facility] PHYSICIAN:Simuel Stebner Tresa Moore, MD  OPERATIVE REPORT  DATE OF PROCEDURE:  12/23/2019  PREOPERATIVE DIAGNOSIS:  Moderate risk prostate cancer.  POSTOPERATIVE DIAGNOSES:  Moderate risk prostate cancer plus right wrist fracture.  PROCEDURE: 1.  Cystoscopy. 2.  Insertion of SpaceOAR. 3.  Prostate brachytherapy seed implantation.  ESTIMATED BLOOD LOSS:  Nil.  COMPLICATIONS:  None.  SPECIMENS:  None.  FINDINGS: 1.  Unremarkable urinary bladder after prostate brachytherapy seed implantation. 2.  Excellent placement of SpaceOAR gel matrix in prerectal space.  RADIATION PARAMETERS:  Prescribed dose 145 Gy delivered over 54 seeds in 24 cannulas.  RADIATION ONCOLOGIST:  Tyler Pita, MD  INDICATIONS:  The patient is a very pleasant, but somewhat unfortunate 69 year old man with history of blindness and prior abdominal trauma.  He was found on workup of elevated PSA to have multifocal adenocarcinoma of the prostate, moderate risk; fairly  significant volume.  He does have some very mild lower urinary tract symptoms at baseline and a small to normal size prostate for age.  Options were discussed for definitive management including surveillance protocols versus surgical extirpation versus  ablative therapies and he elected for prostate brachytherapy with curative intent.  He underwent hormone injection several months ago.  As part of his therapy he presents for brachytherapy today.  Notably, at presentation today the patient states that he  had a fall and tripped approximately 3 weeks ago and a sense of ongoing right wrist discomfort.  His wrist is obviously swollen with some deformity and x-rays confirmed radius fracture.  This was discussed orthopedic colleagues who feel that it is safe  to proceed with prostate brachytherapy today, but splinting the area and  arranging for close outpatient evaluation.  As such, we elected to proceed with prostate brachytherapy SpaceOAR today.  Informed consent was obtained and placed in medical record.  DESCRIPTION OF PROCEDURE:  Patient being patient, verified procedure being prostate brachytherapy seed implantation with cystoscopy and SpaceOAR was confirmed.  Procedure  timeout was performed.  Intravenous antibiotics administered.  General LMA anesthesia induced.  The patient was placed into a medium lithotomy position, sterile field was created, prepped and draped his penis, perineum and proximal thighs.  The stepper  transrectal ultrasound apparatus was brought in.  Prostate was measured and radiation planning occurred as per separate radiation oncology note.  Attention was directed to the brachytherapy seed implantation.  20 cannulas were used to deliver 54 seeds in  an anterior to posterior direction as per radiation oncology plan.  Spot fluoroscopic images upon implantation revealed excellent seed placement without any obvious extraneous strands on AP and oblique imaging.  Patient's penis was then prepped with  iodine and cystoscopy was performed.  Cystourethroscopy revealed unremarkable anterior and posterior urethra.  Inspection of the urinary bladder revealed no diverticula, calcifications, comparable lesions.  There is no evidence of intraluminal brachytherapy seeds or stone material.   Retroflexion showed no new findings and the cystoscope was withdrawn.  Attention was directed at [facility] gel matrix placement.  Then using ____ ultrasound but off of anterior tension and closed spacer final needle was placed transperineally with the tip  at mid gland in the prerectal space.  A test injection of 2 mL of injectable saline was placed, which corroborated proper plane.  Next, the SpaceOAR gel matrix was injected over 10 seconds as per manufacturer's guidelines, which resulted in excellent  posterior displacement of  anterior rectal wall.  Procedure terminated.  The patient tolerated the procedure well.  No immediate complications.  The patient was taken to postanesthesia care in stable condition.  His right wrist area was loosely splinted.  CN/NUANCE  D:12/23/2019 T:12/23/2019 JOB:[id number]

## 2019-12-23 NOTE — Discharge Instructions (Signed)
1 - You may have urinary urgency (bladder spasms) and bloody urine on / off x few weeks. This is normal.  2 - Refrain from lifting activity with Rt hand / arm until cleared by orthopaedic physician.   3 -  Call MD or go to ER for fever >102, severe pain / nausea / vomiting not relieved by medications, or acute change in medical status   Brachytherapy for Prostate Cancer, Care After  This sheet gives you information about how to care for yourself after your procedure. Your health care provider may also give you more specific instructions. If you have problems or questions, contact your health care provider. What can I expect after the procedure? After the procedure, it is common to have:  Trouble passing urine.  Blood in the urine or semen.  Constipation.  Frequent feeling of an urgent need to urinate.  Bruising, swelling, and tenderness of the area behind the scrotum (perineum).  Bloating and gas.  Fatigue.  Burning or pain in the rectum.  Problems getting or keeping an erection (erectile dysfunction).  Nausea. Follow these instructions at home: Managing pain, stiffness, and swelling  If directed, apply ice to the affected area: ? Put ice in a plastic bag. ? Place a towel between your skin and the bag. ? Leave the ice on for 20 minutes, 2-3 times a day.  Try not to sit directly on the area behind the scrotum. A soft cushion can help with discomfort. Activity  Do not drive for 24 hours if you were given a medicine to help you relax (sedative).  Do not drive or use heavy machinery while taking prescription pain medicine.  Rest as told by your health care provider.  Most people can return to normal activities a few days or weeks after the procedure. Ask your health care provider what activities are safe for you. Eating and drinking  Drink enough fluid to keep your urine clear or pale yellow.  Eat a healthy, balanced diet. This includes lean proteins, whole grains,  and plenty of fruits and vegetables. General instructions  Take over-the-counter and prescription medicines only as told by your health care provider.  Keep all follow-up visits as told by your health care provider. This is important. You may still need additional treatment.  Do not take baths, swim, or use a hot tub until your health care provider approves. Shower and wash the area behind the scrotum gently.  Do not have sex for one week after the treatment, or until your health care provider approves.  If you have permanent, low-dose brachytherapy implants: ? Limit close contact with children and pregnant women for 2 months or as told by your health care provider. This is important because of the radiation that is still active in the prostate. ? You may set off radioactive sensors, such as airport screenings. Ask your health care provider for a document that explains your treatment. ? You may be instructed to use a condom during sex for the first 2 months after low-dose brachytherapy. Contact a health care provider if:  You have a fever or chills.  You do not have a bowel movement for 3-4 days after the procedure.  You have diarrhea for 3-4 days after the procedure.  You develop any new symptoms, such as problems with urinating or erectile dysfunction.  You have abdomen (abdominal) pain.  You have more blood in your urine. Get help right away if:  You cannot urinate.  There is excessive bleeding from  your rectum.  You have unusual drainage coming from your rectum.  You have severe pain in the treated area that does not go away with pain medicine.  You have severe nausea or vomiting. Summary  If you have permanent, low-dose brachytherapy implants, limit close contact with children and pregnant women for 2 months or as told by your health care provider. This is important because of the radiation that is still active in the prostate.  Talk with your health care provider  about your risk of brachytherapy side effects, such as erectile dysfunction or urinary problems. Your health care provider will be able to recommend possible treatment options.  Keep all follow-up visits as told by your health care provider. This is important. You may need additional treatment. This information is not intended to replace advice given to you by your health care provider. Make sure you discuss any questions you have with your health care provider. Document Revised: 06/05/2017 Document Reviewed: 07/25/2016 Elsevier Patient Education  [place] Instructions  Activity: Get plenty of rest for the remainder of the day. A responsible adult should stay with you for 24 hours following the procedure.  For the next 24 hours, DO NOT: -Drive a car -Paediatric nurse -Drink alcoholic beverages -Take any medication unless instructed by your physician -Make any legal decisions or sign important papers.  Meals: Start with liquid foods such as gelatin or soup. Progress to regular foods as tolerated. Avoid greasy, spicy, heavy foods. If nausea and/or vomiting occur, drink only clear liquids until the nausea and/or vomiting subsides. Call your physician if vomiting continues.  Special Instructions/Symptoms: Your throat may feel dry or sore from the anesthesia or the breathing tube placed in your throat during surgery. If this causes discomfort, gargle with warm salt water. The discomfort should disappear within 24 hours.  If you had a scopolamine patch placed behind your ear for the management of post- operative nausea and/or vomiting:  1. The medication in the patch is effective for 72 hours, after which it should be removed.  Wrap patch in a tissue and discard in the trash. Wash hands thoroughly with soap and water. 2. You may remove the patch earlier than 72 hours if you experience unpleasant side effects which may include dry mouth, dizziness or  visual disturbances. 3. Avoid touching the patch. Wash your hands with soap and water after contact with the patch.

## 2019-12-26 ENCOUNTER — Encounter (HOSPITAL_BASED_OUTPATIENT_CLINIC_OR_DEPARTMENT_OTHER): Payer: Self-pay | Admitting: Urology

## 2019-12-29 NOTE — Progress Notes (Signed)
  Radiation Oncology         [phone] ________________________________  Name: Tony Massey MRN: [id number]  Date: 12/29/2019  DOB: 08-May-1951       Prostate Seed Implant  CZ:YSAY, Deneda T, FNP  No ref. provider found  DIAGNOSIS: 69 y.o. 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 Fall at home  W19.XXXA DG Wrist Complete Right   Y92.009 DG Wrist Complete Right  2. Malignant neoplasm of prostate ([place])  C61     PROCEDURE: Insertion of radioactive I-125 seeds into the prostate gland.  RADIATION DOSE: 145 Gy, definitive therapy.  TECHNIQUE: [name] was brought to the operating room with the urologist. He was placed in the dorsolithotomy position. He was catheterized and a rectal tube was inserted. The perineum was shaved, prepped and draped. The ultrasound probe was then introduced into the rectum to see the prostate gland.  TREATMENT DEVICE: A needle grid was attached to the ultrasound probe stand and anchor needles were placed.  3D PLANNING: The prostate was imaged in 3D using a sagittal sweep of the prostate probe. These images were transferred to the planning computer. There, the prostate, urethra and rectum were defined on each axial reconstructed image. Then, the software created an optimized 3D plan and a few seed positions were adjusted. The quality of the plan was reviewed using [facility] information for the target and the following two organs at risk:   Urethra and Rectum.  Then the accepted plan was printed and handed off to the radiation therapist.  Under my supervision, the custom loading of the seeds and spacers was carried out and loaded into sealed vicryl sleeves.  These pre-loaded needles were then placed into the needle holder.[name] Kitchen  PROSTATE VOLUME STUDY:  Using transrectal ultrasound the volume of the prostate was verified to be 20.8 cc.  SPECIAL TREATMENT PROCEDURE/SUPERVISION AND HANDLING: The pre-loaded needles were then delivered under sagittal guidance. A total of 20 needles were used to deposit 54 seeds in the prostate gland. The individual seed activity was 0.379 mCi.  SpaceOAR:  Yes  COMPLEX SIMULATION: At the end of the procedure, an anterior radiograph of the pelvis was obtained to document seed positioning and count. Cystoscopy was performed to check the urethra and bladder.  MICRODOSIMETRY: At the end of the procedure, the patient was emitting 0.127 mR/hr at 1 meter. Accordingly, he was considered safe for hospital discharge.  PLAN: The patient will return to the radiation oncology clinic for post implant CT dosimetry in three weeks.   ________________________________  Sheral Apley Tammi Klippel, M.D.

## 2019-12-30 ENCOUNTER — Encounter (HOSPITAL_COMMUNITY): Payer: Self-pay | Admitting: Orthopaedic Surgery

## 2019-12-30 ENCOUNTER — Other Ambulatory Visit: Payer: Self-pay

## 2019-12-30 NOTE — Progress Notes (Signed)
Pt denies SOB, chest pain, and being under the care of a cardiologist. Novella Rob is PCP. Pt denies having a stress test, echo and cardiac cath. Pt denies labs since last procedure. Pt made aware to stop taking Aspirin (unless otherwise advised by surgeon), vitamins, fish oil and herbal medications. Do not take any NSAIDs ie: Ibuprofen, Advil, Naproxen (Aleve), Motrin, BC and Goody Powder. Pt reminded to quarantine. Pt verbalized understanding of all pre-op instructions.

## 2019-12-31 ENCOUNTER — Other Ambulatory Visit (HOSPITAL_COMMUNITY)
Admission: RE | Admit: 2019-12-31 | Discharge: 2019-12-31 | Disposition: A | Discharge status: Home / Self Care | Payer: Medicare Other | Source: Ambulatory Visit | Attending: Orthopaedic Surgery | Admitting: Orthopaedic Surgery

## 2019-12-31 DIAGNOSIS — Z20822 Contact with and (suspected) exposure to covid-19: Secondary | ICD-10-CM | POA: Insufficient documentation

## 2019-12-31 DIAGNOSIS — Z01812 Encounter for preprocedural laboratory examination: Secondary | ICD-10-CM | POA: Diagnosis present

## 2019-12-31 LAB — SARS CORONAVIRUS 2 (TAT 6-24 HRS): SARS Coronavirus 2: NEGATIVE

## 2020-01-02 ENCOUNTER — Other Ambulatory Visit: Payer: Self-pay

## 2020-01-02 ENCOUNTER — Encounter (HOSPITAL_COMMUNITY)
Admission: RE | Disposition: A | Discharge status: Home / Self Care | Payer: Self-pay | Source: Home / Self Care | Attending: Orthopaedic Surgery

## 2020-01-02 ENCOUNTER — Encounter (HOSPITAL_COMMUNITY): Payer: Self-pay | Admitting: Orthopaedic Surgery

## 2020-01-02 ENCOUNTER — Ambulatory Visit (HOSPITAL_COMMUNITY)
Admission: RE | Admit: 2020-01-02 | Discharge: 2020-01-02 | Disposition: A | Discharge status: Home / Self Care | Payer: Medicare Other | Attending: Orthopaedic Surgery | Admitting: Orthopaedic Surgery

## 2020-01-02 ENCOUNTER — Ambulatory Visit (HOSPITAL_COMMUNITY): Payer: Medicare Other | Admitting: Anesthesiology

## 2020-01-02 DIAGNOSIS — S52571A Other intraarticular fracture of lower end of right radius, initial encounter for closed fracture: Secondary | ICD-10-CM | POA: Insufficient documentation

## 2020-01-02 DIAGNOSIS — E039 Hypothyroidism, unspecified: Secondary | ICD-10-CM | POA: Insufficient documentation

## 2020-01-02 DIAGNOSIS — Z79899 Other long term (current) drug therapy: Secondary | ICD-10-CM | POA: Insufficient documentation

## 2020-01-02 DIAGNOSIS — S04019S Injury of optic nerve, unspecified eye, sequela: Secondary | ICD-10-CM | POA: Insufficient documentation

## 2020-01-02 DIAGNOSIS — Z8546 Personal history of malignant neoplasm of prostate: Secondary | ICD-10-CM | POA: Insufficient documentation

## 2020-01-02 DIAGNOSIS — W3400XS Accidental discharge from unspecified firearms or gun, sequela: Secondary | ICD-10-CM | POA: Diagnosis not present

## 2020-01-02 DIAGNOSIS — F17210 Nicotine dependence, cigarettes, uncomplicated: Secondary | ICD-10-CM | POA: Insufficient documentation

## 2020-01-02 DIAGNOSIS — H548 Legal blindness, as defined in USA: Secondary | ICD-10-CM | POA: Insufficient documentation

## 2020-01-02 DIAGNOSIS — Z79890 Hormone replacement therapy (postmenopausal): Secondary | ICD-10-CM | POA: Insufficient documentation

## 2020-01-02 DIAGNOSIS — W19XXXA Unspecified fall, initial encounter: Secondary | ICD-10-CM | POA: Diagnosis not present

## 2020-01-02 HISTORY — DX: Presence of dental prosthetic device (complete) (partial): Z97.2

## 2020-01-02 HISTORY — DX: Complete loss of teeth, unspecified cause, unspecified class: K08.109

## 2020-01-02 HISTORY — PX: ORIF WRIST FRACTURE: SHX2133

## 2020-01-02 HISTORY — DX: Unspecified fracture of the lower end of right radius, initial encounter for closed fracture: S52.501A

## 2020-01-02 HISTORY — DX: Unilateral inguinal hernia, without obstruction or gangrene, not specified as recurrent: K40.90

## 2020-01-02 LAB — SURGICAL PCR SCREEN
MRSA, PCR: NEGATIVE
Staphylococcus aureus: NEGATIVE

## 2020-01-02 SURGERY — OPEN REDUCTION INTERNAL FIXATION (ORIF) WRIST FRACTURE
Anesthesia: Monitor Anesthesia Care | Site: Wrist | Laterality: Right

## 2020-01-02 MED ORDER — LIDOCAINE 2% (20 MG/ML) 5 ML SYRINGE
INTRAMUSCULAR | Status: DC | PRN
  Administered 2020-01-02: 60 mg via INTRAVENOUS

## 2020-01-02 MED ORDER — PROPOFOL 500 MG/50ML IV EMUL
INTRAVENOUS | Status: DC | PRN
  Administered 2020-01-02: 75 ug/kg/min via INTRAVENOUS

## 2020-01-02 MED ORDER — PROPOFOL 10 MG/ML IV BOLUS
INTRAVENOUS | Status: DC | PRN
  Administered 2020-01-02 (×2): 20 mg via INTRAVENOUS
  Administered 2020-01-02: 30 mg via INTRAVENOUS

## 2020-01-02 MED ORDER — CHLORHEXIDINE GLUCONATE 4 % EX LIQD: 60.0000 mL | Freq: Once | CUTANEOUS | Status: DC

## 2020-01-02 MED ORDER — DEXAMETHASONE SODIUM PHOSPHATE 4 MG/ML IJ SOLN
INTRAMUSCULAR | Status: DC | PRN
  Administered 2020-01-02: 6 mg via INTRAVENOUS

## 2020-01-02 MED ORDER — ONDANSETRON HCL 4 MG/2ML IJ SOLN
INTRAMUSCULAR | Status: AC
  Filled 2020-01-02: qty 2

## 2020-01-02 MED ORDER — FENTANYL CITRATE (PF) 250 MCG/5ML IJ SOLN
INTRAMUSCULAR | Status: AC
  Filled 2020-01-02: qty 5

## 2020-01-02 MED ORDER — MIDAZOLAM HCL 2 MG/2ML IJ SOLN
INTRAMUSCULAR | Status: AC
  Administered 2020-01-02: 1 mg via INTRAVENOUS
  Filled 2020-01-02: qty 2

## 2020-01-02 MED ORDER — SODIUM CHLORIDE 0.9 % IR SOLN
Status: DC | PRN
  Administered 2020-01-02: 1000 mL

## 2020-01-02 MED ORDER — LACTATED RINGERS IV SOLN: INTRAVENOUS | Status: DC | PRN

## 2020-01-02 MED ORDER — ACETAMINOPHEN 500 MG PO TABS
1000.0000 mg | ORAL_TABLET | Freq: Once | ORAL | Status: AC
  Administered 2020-01-02: 1000 mg via ORAL
  Filled 2020-01-02: qty 2

## 2020-01-02 MED ORDER — LACTATED RINGERS IV SOLN: INTRAVENOUS | Status: DC

## 2020-01-02 MED ORDER — MIDAZOLAM HCL 2 MG/2ML IJ SOLN
INTRAMUSCULAR | Status: AC
  Filled 2020-01-02: qty 2

## 2020-01-02 MED ORDER — MIDAZOLAM HCL 5 MG/5ML IJ SOLN
INTRAMUSCULAR | Status: DC | PRN
  Administered 2020-01-02: 2 mg via INTRAVENOUS

## 2020-01-02 MED ORDER — BUPIVACAINE-EPINEPHRINE (PF) 0.5% -1:200000 IJ SOLN
INTRAMUSCULAR | Status: DC | PRN
  Administered 2020-01-02: 30 mL via PERINEURAL

## 2020-01-02 MED ORDER — ORAL CARE MOUTH RINSE: 15.0000 mL | Freq: Once | OROMUCOSAL | Status: AC

## 2020-01-02 MED ORDER — ONDANSETRON HCL 4 MG/2ML IJ SOLN
INTRAMUSCULAR | Status: DC | PRN
  Administered 2020-01-02: 4 mg via INTRAVENOUS

## 2020-01-02 MED ORDER — CEFAZOLIN SODIUM-DEXTROSE 2-4 GM/100ML-% IV SOLN
2.0000 g | INTRAVENOUS | Status: AC
  Administered 2020-01-02: 2 g via INTRAVENOUS

## 2020-01-02 MED ORDER — POVIDONE-IODINE 10 % EX SWAB
2.0000 "application " | Freq: Once | CUTANEOUS | Status: AC
  Administered 2020-01-02: 2 via TOPICAL

## 2020-01-02 MED ORDER — FENTANYL CITRATE (PF) 100 MCG/2ML IJ SOLN
INTRAMUSCULAR | Status: AC
  Administered 2020-01-02: 50 ug via INTRAVENOUS
  Filled 2020-01-02: qty 2

## 2020-01-02 MED ORDER — PROPOFOL 10 MG/ML IV BOLUS
INTRAVENOUS | Status: AC
  Filled 2020-01-02: qty 20

## 2020-01-02 MED ORDER — CEFAZOLIN SODIUM-DEXTROSE 2-4 GM/100ML-% IV SOLN
INTRAVENOUS | Status: AC
  Filled 2020-01-02: qty 100

## 2020-01-02 MED ORDER — HYDROCODONE-ACETAMINOPHEN 5-325 MG PO TABS: 1.0000 | ORAL_TABLET | Freq: Four times a day (QID) | ORAL | 0 refills | Status: DC | PRN

## 2020-01-02 MED ORDER — LIDOCAINE 2% (20 MG/ML) 5 ML SYRINGE
INTRAMUSCULAR | Status: AC
  Filled 2020-01-02: qty 5

## 2020-01-02 MED ORDER — HYDROMORPHONE HCL 1 MG/ML IJ SOLN: 0.2500 mg | INTRAMUSCULAR | Status: DC | PRN

## 2020-01-02 MED ORDER — CHLORHEXIDINE GLUCONATE 0.12 % MT SOLN: 15.0000 mL | Freq: Once | OROMUCOSAL | Status: AC

## 2020-01-02 MED ORDER — MIDAZOLAM HCL 2 MG/2ML IJ SOLN: 1.0000 mg | Freq: Once | INTRAMUSCULAR | Status: AC

## 2020-01-02 MED ORDER — CHLORHEXIDINE GLUCONATE 0.12 % MT SOLN
OROMUCOSAL | Status: AC
  Administered 2020-01-02: 15 mL via OROMUCOSAL
  Filled 2020-01-02: qty 15

## 2020-01-02 MED ORDER — FENTANYL CITRATE (PF) 100 MCG/2ML IJ SOLN: 50.0000 ug | Freq: Once | INTRAMUSCULAR | Status: AC

## 2020-01-02 MED ORDER — DEXAMETHASONE SODIUM PHOSPHATE 10 MG/ML IJ SOLN
INTRAMUSCULAR | Status: AC
  Filled 2020-01-02: qty 1

## 2020-01-02 SURGICAL SUPPLY — 49 items
APL PRP STRL LF DISP 70% ISPRP (MISCELLANEOUS) ×1
BIT DRILL 2.0 LNG QUCK RELEASE (BIT) ×1 IMPLANT
BIT DRILL 2.8 QUICK RELEASE (BIT) ×1 IMPLANT
BNDG CMPR 9X4 STRL LF SNTH (GAUZE/BANDAGES/DRESSINGS) ×1
BNDG ESMARK 4X9 LF (GAUZE/BANDAGES/DRESSINGS) ×2 IMPLANT
BNDG GAUZE ELAST 4 BULKY (GAUZE/BANDAGES/DRESSINGS) ×2 IMPLANT
CANISTER SUCT 3000ML PPV (MISCELLANEOUS) ×2 IMPLANT
CHLORAPREP W/TINT 26 (MISCELLANEOUS) ×2 IMPLANT
CORD BIPOLAR FORCEPS 12FT (ELECTRODE) ×2 IMPLANT
COVER SURGICAL LIGHT HANDLE (MISCELLANEOUS) ×2 IMPLANT
CUFF TOURN SGL QUICK 18X4 (TOURNIQUET CUFF) ×2 IMPLANT
CUFF TOURN SGL QUICK 24 (TOURNIQUET CUFF)
CUFF TRNQT CYL 24X4X16.5-23 (TOURNIQUET CUFF) IMPLANT
DRAPE OEC MINIVIEW 54X84 (DRAPES) ×2 IMPLANT
DRAPE SURG 17X23 STRL (DRAPES) ×2 IMPLANT
DRILL 2.0 LNG QUICK RELEASE (BIT) ×2
DRILL 2.8 QUICK RELEASE (BIT) ×2
DRSG XEROFORM 1X8 (GAUZE/BANDAGES/DRESSINGS) ×2 IMPLANT
GAUZE SPONGE 4X4 12PLY STRL (GAUZE/BANDAGES/DRESSINGS) ×2 IMPLANT
GLOVE INDICATOR 8.0 STRL GRN (GLOVE) ×2 IMPLANT
GLOVE SURG SYN 7.5  E (GLOVE) ×2
GLOVE SURG SYN 7.5 E (GLOVE) ×1 IMPLANT
GOWN STRL REUS W/ TWL LRG LVL3 (GOWN DISPOSABLE) ×2 IMPLANT
GOWN STRL REUS W/TWL LRG LVL3 (GOWN DISPOSABLE) ×4
GUIDEWIRE ORTHO 0.054X6 (WIRE) ×8 IMPLANT
KIT BASIN OR (CUSTOM PROCEDURE TRAY) ×2 IMPLANT
KIT TURNOVER KIT B (KITS) ×2 IMPLANT
NS IRRIG 1000ML POUR BTL (IV SOLUTION) ×2 IMPLANT
PACK ORTHO EXTREMITY (CUSTOM PROCEDURE TRAY) ×2 IMPLANT
PAD ARMBOARD 7.5X6 YLW CONV (MISCELLANEOUS) ×4 IMPLANT
PAD CAST 3X4 CTTN HI CHSV (CAST SUPPLIES) ×1 IMPLANT
PADDING CAST COTTON 3X4 STRL (CAST SUPPLIES) ×2
PLATE STD RT ACULOC 2 (Plate) ×2 IMPLANT
PUTTY DBX 1CC (Putty) ×2 IMPLANT
PUTTY DBX 1CC DEPUY (Putty) ×1 IMPLANT
SCREW CORT FT 18X2.3XLCK HEX (Screw) ×2 IMPLANT
SCREW CORT FT 20X2.3XLCK HEX (Screw) ×5 IMPLANT
SCREW CORTICAL LOCKING 2.3X18M (Screw) ×4 IMPLANT
SCREW CORTICAL LOCKING 2.3X20M (Screw) ×10 IMPLANT
SCREW HEXALOBE LOCKING 3.5X14M (Screw) ×2 IMPLANT
SCREW HEXALOBE LOCKING 3.5X16M (Screw) ×2 IMPLANT
SCREW HEXALOBE NON-LOCK 3.5X14 (Screw) ×2 IMPLANT
SLING ARM IMMOBILIZER MED (SOFTGOODS) ×2 IMPLANT
SPLINT FIBERGLASS 3X12 (CAST SUPPLIES) ×2 IMPLANT
SUT VIC AB 3-0 PS2 18 (SUTURE) ×2 IMPLANT
TOWEL GREEN STERILE (TOWEL DISPOSABLE) ×2 IMPLANT
TOWEL GREEN STERILE FF (TOWEL DISPOSABLE) ×2 IMPLANT
TUBE CONNECTING 12X1/4 (SUCTIONS) ×2 IMPLANT
UNDERPAD 30X36 HEAVY ABSORB (UNDERPADS AND DIAPERS) ×2 IMPLANT
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
## 2020-01-02 NOTE — Anesthesia Preprocedure Evaluation (Addendum)
Anesthesia Evaluation  Patient identified by MRN, date of birth, ID band Patient awake    Reviewed: Allergy & Precautions, H&P , NPO status , Patient's Chart, lab work & pertinent test results  Airway Mallampati: II  TM Distance: >3 FB Neck ROM: Full    Dental no notable dental hx. (+) Teeth Intact, Dental Advisory Given   Pulmonary asthma , Current SmokerPatient did not abstain from smoking.,    Pulmonary exam normal breath sounds clear to auscultation       Cardiovascular negative cardio ROS   Rhythm:Regular Rate:Normal     Neuro/Psych negative neurological ROS  negative psych ROS   GI/Hepatic negative GI ROS, Neg liver ROS,   Endo/Other  Hypothyroidism   Renal/GU negative Renal ROS  negative genitourinary   Musculoskeletal  (+) Arthritis , Osteoarthritis,    Abdominal   Peds  Hematology negative hematology ROS (+)   Anesthesia Other Findings   Reproductive/Obstetrics negative OB ROS                            Anesthesia Physical Anesthesia Plan  ASA: II  Anesthesia Plan: MAC and Regional   Post-op Pain Management:    Induction: Intravenous  PONV Risk Score and Plan: 0 and Propofol infusion, Midazolam and Ondansetron  Airway Management Planned: Simple Face Mask  Additional Equipment:   Intra-op Plan:   Post-operative Plan:   Informed Consent: I have reviewed the patients History and Physical, chart, labs and discussed the procedure including the risks, benefits and alternatives for the proposed anesthesia with the patient or authorized representative who has indicated his/her understanding and acceptance.     Dental advisory given  Plan Discussed with: CRNA  Anesthesia Plan Comments:         Anesthesia Quick Evaluation
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
## 2020-01-02 NOTE — Anesthesia Procedure Notes (Signed)
Procedure Name: MAC Date/Time: 01/02/2020 10:45 AM Performed by: Orlie Dakin, CRNA Pre-anesthesia Checklist: Patient identified, Emergency Drugs available, Suction available and Patient being monitored Patient Re-evaluated:Patient Re-evaluated prior to induction Oxygen Delivery Method: Nasal cannula Preoxygenation: Pre-oxygenation with 100% oxygen Induction Type: IV induction Placement Confirmation: positive ETCO2

## 2020-01-02 NOTE — Anesthesia Postprocedure Evaluation (Signed)
Anesthesia Post Note  Patient: Tony Massey  Procedure(s) Performed: Right distal radius fracture open reduction and internal fixation and surgery as indicated (Right Wrist)     Patient location during evaluation: PACU Anesthesia Type: Regional and MAC Level of consciousness: awake and alert Pain management: pain level controlled Vital Signs Assessment: post-procedure vital signs reviewed and stable Respiratory status: spontaneous breathing, nonlabored ventilation and respiratory function stable Cardiovascular status: stable and blood pressure returned to baseline Postop Assessment: no apparent nausea or vomiting Anesthetic complications: no   No complications documented.  Last Vitals:  Vitals:   01/02/20 1136 01/02/20 1150  BP: 124/75 117/83  Pulse: 81 85  Resp: 16 13  Temp: 36.8 C (!) 36.3 C  SpO2: 100% 100%    Last Pain:  Vitals:   01/02/20 1150  TempSrc:   PainSc: 0-No pain                 Jariah Tarkowski,W. EDMOND

## 2020-01-02 NOTE — Transfer of Care (Signed)
Immediate Anesthesia Transfer of Care Note  Patient: Tony Massey  Procedure(s) Performed: Right distal radius fracture open reduction and internal fixation and surgery as indicated (Right Wrist)  Patient Location: PACU  Anesthesia Type:MAC and Regional  Level of Consciousness: awake, oriented and patient cooperative  Airway & Oxygen Therapy: Patient Spontanous Breathing and Patient connected to nasal cannula oxygen  Post-op Assessment: Report given to RN and Post -op Vital signs reviewed and stable  Post vital signs: Reviewed and stable  Last Vitals:  Vitals Value Taken Time  BP    Temp    Pulse 81 01/02/20 1136  Resp 16 01/02/20 1136  SpO2 90 % 01/02/20 1136  Vitals shown include unvalidated device data.  Last Pain:  Vitals:   01/02/20 1005  TempSrc:   PainSc: 0-No pain         Complications: No complications documented.

## 2020-01-02 NOTE — Discharge Instructions (Signed)
Discharge Instructions  - Keep dressings in place. Do not remove them. - The dressings must stay dry - Take all medication as prescribed. Transition to over the counter pain medication as your pain improves - Keep the hand elevated over the next 48-72 hours to help with pain and swelling - Move all digits not restricted by the dressings regularly to prevent stiffness - Please call to schedule a follow up appointment with Dr. Avarie Tavano and therapy at [phone] for 10-14 days following surgery - Your pain medication have been sent digitally to your pharmacy  

## 2020-01-02 NOTE — Anesthesia Procedure Notes (Signed)
Anesthesia Regional Block: Supraclavicular block   Pre-Anesthetic Checklist: ,, timeout performed, Correct Patient, Correct Site, Correct Laterality, Correct Procedure, Correct Position, site marked, Risks and benefits discussed, pre-op evaluation,  At surgeon's request and post-op pain management  Laterality: Right  Prep: Maximum Sterile Barrier Precautions used, Betadine       Needles:  Injection technique: Single-shot  Needle Type: Other     Needle Length: 9cm  Needle Gauge: 21     Additional Needles:   Narrative:  Start time: 01/02/2020 9:45 AM End time: 01/02/2020 9:55 AM Injection made incrementally with aspirations every 5 mL.  Additional Notes: 2% Lidocaine skin wheel.

## 2020-01-03 ENCOUNTER — Encounter (HOSPITAL_COMMUNITY): Payer: Self-pay | Admitting: Orthopaedic Surgery

## 2020-01-17 ENCOUNTER — Telehealth: Payer: Self-pay | Admitting: *Deleted

## 2020-01-17 NOTE — Telephone Encounter (Signed)
CALLED PATIENT TO REMIND OF POST SEED APPTS. FOR 01-18-20, LVM FOR A RETURN CALL

## 2020-01-18 ENCOUNTER — Ambulatory Visit
Admission: RE | Admit: 2020-01-18 | Discharge: 2020-01-18 | Disposition: A | Discharge status: Home / Self Care | Payer: Commercial Managed Care - PPO | Source: Ambulatory Visit | Attending: Urology | Admitting: Urology

## 2020-01-18 ENCOUNTER — Other Ambulatory Visit: Payer: Self-pay

## 2020-01-18 ENCOUNTER — Ambulatory Visit
Admission: RE | Admit: 2020-01-18 | Discharge: 2020-01-18 | Disposition: A | Discharge status: Home / Self Care | Payer: Medicare HMO | Source: Ambulatory Visit | Attending: Radiation Oncology | Admitting: Radiation Oncology

## 2020-01-18 ENCOUNTER — Encounter: Payer: Self-pay | Admitting: Urology

## 2020-01-18 VITALS — BP 112/71 | HR 81 | Temp 98.0°F | Resp 24 | Ht 67.5 in | Wt 130.0 lb

## 2020-01-18 DIAGNOSIS — C61 Malignant neoplasm of prostate: Secondary | ICD-10-CM

## 2020-01-18 NOTE — Progress Notes (Addendum)
Radiation Oncology         [phone] ________________________________  Name: Tony Massey MRN: [id number]  Date: 01/18/2020  DOB: 01/18/1951  Post-Seed Follow-Up Visit Note  CC: Tony Brasil, FNP (Inactive)  Tony Frock, MD  Diagnosis:   69 y.o. 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    ICD-10-CM   1. Malignant neoplasm of prostate ([place])  C61     Interval Since Last Radiation:  3.5 weeks 12/23/19:  Insertion of radioactive I-125 seeds into the prostate gland; 145 Gy, definitive therapy with placement of SpaceOAR VUE gel; concurrent with ST-ADT (Eligard-10/26/19)  Narrative:  The patient returns today for routine follow-up.  He is complaining of increased urinary frequency and urinary hesitation symptoms. He filled out a questionnaire regarding urinary function today providing and overall IPSS score of 18 characterizing his symptoms as moderate with nocturia x5/night, weak stream, urgency and frequency which are gradually improving.  He is taking Flomax daily as prescribed. He has mild, occasional dysuria at the start of his stream but otherwise, denies dysuria and has not had fever, chills or suprapubic discomfort.  He feels he empties his bladder adequeately for the most part and denies straining or incontinence  His pre-implant score was 5. He denies any abdominal pain or bowel symptoms. He has modest fatigue and occasional hot flashes associated with ADT but overall, he is pleased with his progress to date and feels he is tolerating his treatment well.  ALLERGIES:  has No Known Allergies.  Meds: Current Outpatient Medications  Medication Sig Dispense Refill  . acetaminophen (TYLENOL) 325 MG tablet Take 650 mg by mouth every 6 (six) hours as needed for mild pain.     . Carboxymethylcellul-Glycerin (CLEAR EYES FOR DRY EYES) 1-0.25 % SOLN Apply 1 drop to eye daily as needed (red eyes).    Tony Massey 25 MCG tablet Take 25  mcg by mouth daily.    [name] Kitchen HYDROcodone-acetaminophen (NORCO/VICODIN) 5-325 MG tablet Take 1-2 tablets by mouth every 6 (six) hours as needed for moderate pain. 30 tablet 0  . ibuprofen (ADVIL) 200 MG tablet Take 600-800 mg by mouth every 6 (six) hours as needed for mild pain.    . tamsulosin (FLOMAX) 0.4 MG CAPS capsule Take 1 capsule (0.4 mg total) by mouth daily as needed. For urinary urgency / weak stream after prostate radiation. (Patient not taking: Reported on 01/02/2020) 30 capsule 11  . traMADol (ULTRAM) 50 MG tablet Take 1-2 tablets (50-100 mg total) by mouth every 6 (six) hours as needed for moderate pain or severe pain (Post-operatively). 20 tablet 0   No current facility-administered medications for this encounter.    Physical Findings: In general this is a well appearing Caucasian male in no acute distress. He's alert and oriented x4 and appropriate throughout the examination. Cardiopulmonary assessment is negative for acute distress and he exhibits normal effort.   Lab Findings: Lab Results  Component Value Date   WBC 9.3 12/20/2019   HGB 11.2 (L) 12/23/2019   HCT 33.0 (L) 12/23/2019   MCV 98.8 12/20/2019   PLT 192 12/20/2019    Radiographic Findings:  Patient underwent CT imaging in our clinic for post implant dosimetry. The CT will be reviewed by Dr. Tammi Massey to confirm there is an adequate distribution of radioactive seeds throughout the prostate gland and ensure that there are no seeds in or near the rectum. His procedure included placement of SpaceOAR VUE  gel which is easily visible on CT scan for verification of appropriate distribution. We suspect the final radiation plan and dosimetry will show appropriate coverage of the prostate gland. He understands that we will call and inform him of any unexpected findings on further review of his imaging and dosimetry.  Impression/Plan: 69 y.o. 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  The patient is recovering from the effects of radiation. His urinary symptoms should gradually improve over the next 4-6 months. We talked about this today. He is encouraged by his improvement already and is otherwise pleased with his outcome. We also talked about long-term follow-up for prostate cancer following seed implant. He understands that ongoing PSA determinations and digital rectal exams will help perform surveillance to rule out disease recurrence. He has a follow up appointment scheduled with Dr. Tresa Massey following our visit this afternoon. He understands what to expect with his PSA measures. Patient was also educated today about some of the long-term effects from radiation including a small risk for rectal bleeding and possibly erectile dysfunction. We talked about some of the general management approaches to these potential complications. However, I did encourage the patient to contact our office or return at any point if he has questions or concerns related to his previous radiation and prostate cancer.  Today, a comprehensive survivorship care plan and treatment summary was reviewed with the patient today detailing his prostate cancer diagnosis, treatment course, potential late/long-term effects of treatment, appropriate follow-up care with recommendations for the future, and patient education resources.  A copy of this summary, along with a letter will be sent to the patient's primary care provider via mail/fax/In Basket message after today's visit.    2. Cancer screening:  Due to Tony Massey history and his age, he should receive screening for skin cancers, colon cancer, and lung cancer.  The information and recommendations are listed on the patient's comprehensive care plan/treatment summary and were reviewed in detail with the patient.      3. Health maintenance and wellness promotion: Tony Massey was encouraged to consume 5-7 servings of fruits and vegetables per day. He was  provided a copy of the "Nutrition Rainbow" handout, as well as the handout "Take Control of Your Health and [place]" from the [place].  He was also encouraged to engage in moderate to vigorous exercise for 30 minutes per day most days of the week. Information was provided regarding the [facility] fitness program, which is designed for cancer survivors to help them become more physically fit after cancer treatments. We discussed that a healthy BMI is 18.5-24.9 and that maintaining a healthy weight reduces risk of cancer recurrences.  He was instructed to limit his alcohol consumption and was encouraged to stop smoking.  Lastly, he was encouraged to use sunscreen and wear protective clothing when in the sun.     4. Support services/counseling: It is not uncommon for this period of the patient's cancer care trajectory to be one of many emotions and stressors.  Mr. Hollyfield was encouraged to take advantage of our many support services programs, support groups, and/or counseling in coping with his new life as a cancer survivor after completing anti-cancer treatment.  He was offered support today through active listening and expressive supportive counseling.  He was given information regarding our available services and encouraged to contact me with any questions or for help enrolling in any of our support  group/programs.       Nicholos Johns, PA-C

## 2020-01-18 NOTE — Addendum Note (Signed)
Encounter addended by: Freeman Caldron, PA-C on: 01/18/2020 3:14 PM  Actions taken: Clinical Note Signed

## 2020-01-18 NOTE — Progress Notes (Signed)
Patient presents today for post seed. Patient states nocturia 5-6 times per night. Patient states mild dysuria. Patient states regular bowel movements. Patient states that he is emptying his bladder completely. Patient states urine stream is moderate. Patient states having urgency. Patient states that he is able to hold urine and make it to the bathroom. Patient denies straining. Patient denies hesitancy. Patient denies having leakage.

## 2020-01-19 ENCOUNTER — Encounter: Payer: Self-pay | Admitting: Medical Oncology

## 2020-01-21 NOTE — Progress Notes (Signed)
  Radiation Oncology         [phone] ________________________________  Name: Tony Massey MRN: [id number]  Date: 01/18/2020  DOB: 04-09-1951  COMPLEX SIMULATION NOTE  NARRATIVE:  The patient was brought to the [facility] today following prostate seed implantation approximately one month ago.  Identity was confirmed.  All relevant records and images related to the planned course of therapy were reviewed.  Then, the patient was set-up supine.  CT images were obtained.  The CT images were loaded into the planning software.  Then the prostate and rectum were contoured.  Treatment planning then occurred.  The implanted iodine 125 seeds were identified by the physics staff for projection of radiation distribution  I have requested : 3D Simulation  I have requested a DVH of the following structures: Prostate and rectum.    ________________________________  Sheral Apley Tammi Klippel, M.D.

## 2020-02-02 ENCOUNTER — Other Ambulatory Visit: Payer: Self-pay | Admitting: General Practice

## 2020-02-02 DIAGNOSIS — B182 Chronic viral hepatitis C: Secondary | ICD-10-CM

## 2020-03-16 ENCOUNTER — Encounter: Payer: Self-pay | Admitting: Radiation Oncology

## 2020-03-16 ENCOUNTER — Ambulatory Visit
Admission: RE | Admit: 2020-03-16 | Discharge: 2020-03-16 | Disposition: A | Discharge status: Home / Self Care | Payer: Medicare HMO | Source: Ambulatory Visit | Attending: Radiation Oncology | Admitting: Radiation Oncology

## 2020-03-16 DIAGNOSIS — C61 Malignant neoplasm of prostate: Secondary | ICD-10-CM | POA: Insufficient documentation

## 2020-04-17 NOTE — Progress Notes (Signed)
  Radiation Oncology         [phone] ________________________________  Name: Tony Massey MRN: [id number]  Date: 03/16/2020  DOB: 1951/02/21  3D Planning Note   Prostate Brachytherapy Post-Implant Dosimetry  Diagnosis: 69 y.o. 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  Narrative: On a previous date, Tony Massey returned following prostate seed implantation for post implant planning. He underwent CT scan complex simulation to delineate the three-dimensional structures of the pelvis and demonstrate the radiation distribution.  Since that time, the seed localization, and complex isodose planning with dose volume histograms have now been completed.  Results:   Prostate Coverage - The dose of radiation delivered to the 90% or more of the prostate gland (D90) was 108.16% of the prescription dose. This exceeds our goal of greater than 90%. Rectal Sparing - The volume of rectal tissue receiving the prescription dose or higher was 0.0 cc. This falls under our thresholds tolerance of 1.0 cc.  Impression: The prostate seed implant appears to show adequate target coverage and appropriate rectal sparing.  Plan:  The patient will continue to follow with urology for ongoing PSA determinations. I would anticipate a high likelihood for local tumor control with minimal risk for rectal morbidity.  ________________________________  Sheral Apley Tammi Klippel, M.D.

## 2020-09-05 ENCOUNTER — Other Ambulatory Visit: Payer: Self-pay

## 2020-09-05 ENCOUNTER — Encounter: Payer: Self-pay | Admitting: Internal Medicine

## 2020-09-05 ENCOUNTER — Ambulatory Visit: Payer: Medicare HMO | Admitting: Internal Medicine

## 2020-09-05 VITALS — BP 144/82 | HR 88 | Ht 67.5 in | Wt 148.0 lb

## 2020-09-05 DIAGNOSIS — E871 Hypo-osmolality and hyponatremia: Secondary | ICD-10-CM | POA: Diagnosis not present

## 2020-09-05 DIAGNOSIS — E039 Hypothyroidism, unspecified: Secondary | ICD-10-CM

## 2020-09-05 LAB — BASIC METABOLIC PANEL
BUN: 5 mg/dL — ABNORMAL LOW (ref 6–23)
CO2: 29 mEq/L (ref 19–32)
Calcium: 10 mg/dL (ref 8.4–10.5)
Chloride: 96 mEq/L (ref 96–112)
Creatinine, Ser: 0.9 mg/dL (ref 0.40–1.50)
GFR: 88.4 mL/min (ref >60.00)
Glucose, Bld: 81 mg/dL (ref 70–99)
Potassium: 4.3 mEq/L (ref 3.5–5.1)
Sodium: 129 mEq/L — ABNORMAL LOW (ref 135–145)
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
## 2020-09-05 NOTE — Patient Instructions (Signed)

## 2020-09-06 LAB — T4, FREE: Free T4: 0.78 ng/dL (ref 0.60–1.60)

## 2020-09-06 LAB — TSH: TSH: 12.1 u[IU]/mL — ABNORMAL HIGH (ref 0.35–4.50)

## 2020-09-06 MED ORDER — LEVOTHYROXINE SODIUM 50 MCG PO TABS: 50.0000 ug | ORAL_TABLET | Freq: Every day | ORAL | 1 refills | Status: DC

## 2020-09-07 LAB — OSMOLALITY, URINE: Osmolality, Ur: 153 mOsm/kg (ref 50–1200)

## 2020-09-07 LAB — OSMOLALITY: Osmolality: 270 mOsm/kg — ABNORMAL LOW (ref 278–305)

## 2020-09-07 LAB — SODIUM, URINE, RANDOM: Sodium, Ur: 30 mmol/L (ref 28–272)

## 2020-12-07 ENCOUNTER — Ambulatory Visit: Payer: Medicare HMO | Admitting: Internal Medicine

## 2020-12-27 ENCOUNTER — Encounter: Payer: Self-pay | Admitting: Internal Medicine

## 2020-12-27 ENCOUNTER — Other Ambulatory Visit: Payer: Self-pay

## 2020-12-27 ENCOUNTER — Ambulatory Visit (INDEPENDENT_AMBULATORY_CARE_PROVIDER_SITE_OTHER): Payer: Medicare HMO | Admitting: Internal Medicine

## 2020-12-27 VITALS — BP 124/76 | HR 90 | Ht 67.0 in | Wt 153.0 lb

## 2020-12-27 DIAGNOSIS — E039 Hypothyroidism, unspecified: Secondary | ICD-10-CM | POA: Diagnosis not present

## 2020-12-27 DIAGNOSIS — E871 Hypo-osmolality and hyponatremia: Secondary | ICD-10-CM

## 2020-12-27 LAB — BASIC METABOLIC PANEL
BUN: 4 mg/dL — ABNORMAL LOW (ref 6–23)
CO2: 24 mEq/L (ref 19–32)
Calcium: 9.3 mg/dL (ref 8.4–10.5)
Chloride: 94 mEq/L — ABNORMAL LOW (ref 96–112)
Creatinine, Ser: 0.93 mg/dL (ref 0.40–1.50)
GFR: 84.8 mL/min (ref >60.00)
Glucose, Bld: 77 mg/dL (ref 70–99)
Potassium: 3.6 mEq/L (ref 3.5–5.1)
Sodium: 127 mEq/L — ABNORMAL LOW (ref 135–145)

## 2020-12-27 LAB — TSH: TSH: 4.91 u[IU]/mL — ABNORMAL HIGH (ref 0.35–4.50)
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
## 2020-12-27 NOTE — Patient Instructions (Addendum)
-   Try and limit fluid intake to no more then 42 ounce of liquid , around 5.5 cups  - Continue Levothyroxine 50 mcg daily

## 2020-12-28 MED ORDER — LEVOTHYROXINE SODIUM 75 MCG PO TABS: 75.0000 ug | ORAL_TABLET | Freq: Every day | ORAL | 3 refills | Status: DC

## 2020-12-31 LAB — OSMOLALITY, URINE: Osmolality, Ur: 76 mOsm/kg (ref 50–1200)

## 2020-12-31 LAB — OSMOLALITY: Osmolality: 282 mOsm/kg (ref 278–305)

## 2020-12-31 LAB — SODIUM, URINE, RANDOM: Sodium, Ur: 16 mmol/L — ABNORMAL LOW (ref 28–272)

## 2021-05-01 ENCOUNTER — Other Ambulatory Visit: Payer: Self-pay

## 2021-05-01 ENCOUNTER — Ambulatory Visit (INDEPENDENT_AMBULATORY_CARE_PROVIDER_SITE_OTHER): Payer: Medicare HMO | Admitting: Internal Medicine

## 2021-05-01 ENCOUNTER — Encounter: Payer: Self-pay | Admitting: Internal Medicine

## 2021-05-01 VITALS — BP 122/74 | HR 88 | Ht 67.0 in | Wt 149.0 lb

## 2021-05-01 DIAGNOSIS — E871 Hypo-osmolality and hyponatremia: Secondary | ICD-10-CM

## 2021-05-01 DIAGNOSIS — E039 Hypothyroidism, unspecified: Secondary | ICD-10-CM

## 2021-05-01 LAB — BASIC METABOLIC PANEL
BUN: 6 mg/dL (ref 6–23)
CO2: 27 mEq/L (ref 19–32)
Calcium: 9 mg/dL (ref 8.4–10.5)
Chloride: 95 mEq/L — ABNORMAL LOW (ref 96–112)
Creatinine, Ser: 1.09 mg/dL (ref 0.40–1.50)
GFR: 69.92 mL/min (ref >60.00)
Glucose, Bld: 104 mg/dL — ABNORMAL HIGH (ref 70–99)
Potassium: 4.9 mEq/L (ref 3.5–5.1)
Sodium: 127 mEq/L — ABNORMAL LOW (ref 135–145)

## 2021-05-01 LAB — TSH: TSH: 2.55 u[IU]/mL (ref 0.35–5.50)
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
## 2021-05-01 NOTE — Patient Instructions (Signed)
-   Try and limit fluid intake to no more then 42 ounce of liquid , around 5.5 cups  - Continue Levothyroxine 75 mcg daily

## 2021-05-02 LAB — SODIUM, URINE, RANDOM: Sodium, Ur: 24 mmol/L — ABNORMAL LOW (ref 28–272)

## 2021-05-06 ENCOUNTER — Telehealth: Payer: Self-pay | Admitting: Internal Medicine

## 2021-05-06 NOTE — Telephone Encounter (Signed)
PLease let the pt know that his thyroid is normal but his sodium continues to be low and has not budged despite starting sodium,   I am going to need to change his medication but I have to test him for Cortisol first    Please schedule him for a nurse  visit for Cosyntropin stim test . Please let him know this test will require him to be in the office for 1.5 hrs.    Thanks

## 2021-05-06 NOTE — Telephone Encounter (Signed)
Patient notified and scheduled for testing on 05/15/2021

## 2021-05-14 ENCOUNTER — Ambulatory Visit: Payer: Medicare HMO

## 2021-05-15 ENCOUNTER — Other Ambulatory Visit (INDEPENDENT_AMBULATORY_CARE_PROVIDER_SITE_OTHER): Payer: Medicare HMO

## 2021-05-15 ENCOUNTER — Ambulatory Visit (INDEPENDENT_AMBULATORY_CARE_PROVIDER_SITE_OTHER): Payer: Medicare HMO

## 2021-05-15 ENCOUNTER — Other Ambulatory Visit: Payer: Self-pay

## 2021-05-15 DIAGNOSIS — E871 Hypo-osmolality and hyponatremia: Secondary | ICD-10-CM | POA: Diagnosis not present

## 2021-05-15 LAB — CORTISOL
Cortisol, Plasma: 10.2 ug/dL
Cortisol, Plasma: 17.4 ug/dL
Cortisol, Plasma: 21.6 ug/dL

## 2021-05-15 MED ORDER — COSYNTROPIN 0.25 MG IJ SOLR
0.2500 mg | Freq: Once | INTRAMUSCULAR | Status: AC
  Administered 2021-05-15: 0.25 mg via INTRAVENOUS

## 2021-05-15 NOTE — Progress Notes (Signed)
Cosyntropin injection given successful.

## 2021-05-17 ENCOUNTER — Telehealth: Payer: Self-pay | Admitting: Internal Medicine

## 2021-05-17 DIAGNOSIS — E871 Hypo-osmolality and hyponatremia: Secondary | ICD-10-CM

## 2021-05-17 MED ORDER — DEMECLOCYCLINE HCL 150 MG PO TABS: 150.0000 mg | ORAL_TABLET | Freq: Two times a day (BID) | ORAL | 6 refills | Status: DC

## 2021-05-17 NOTE — Telephone Encounter (Signed)
Please let the pt know that that test we did on him (cosyntropin stimulation test ) is normal     His sodium continues to be low and I suggest that he starts a new medicine " Demeclocyline " twice a day   He needs to STOP sodium tablets at this time   He also needs to stop Ibuprofen if possible because Ibuprofen and Demeclocycline can affect the kidney   Please put him on the lab schedule in  4 weeks to check sodium and kidney function    Thanks

## 2021-05-17 NOTE — Telephone Encounter (Signed)
Patient notified and will pick up new medication.  

## 2021-06-05 ENCOUNTER — Telehealth: Payer: Self-pay | Admitting: Internal Medicine

## 2021-06-05 ENCOUNTER — Ambulatory Visit (INDEPENDENT_AMBULATORY_CARE_PROVIDER_SITE_OTHER): Payer: Medicare HMO | Admitting: Plastic Surgery

## 2021-06-05 ENCOUNTER — Other Ambulatory Visit: Payer: Self-pay

## 2021-06-05 ENCOUNTER — Encounter: Payer: Self-pay | Admitting: Plastic Surgery

## 2021-06-05 VITALS — BP 153/81 | HR 98 | Ht 67.5 in | Wt 152.0 lb

## 2021-06-05 DIAGNOSIS — L989 Disorder of the skin and subcutaneous tissue, unspecified: Secondary | ICD-10-CM

## 2021-06-05 MED ORDER — DEMECLOCYCLINE HCL 150 MG PO TABS: 150.0000 mg | ORAL_TABLET | Freq: Two times a day (BID) | ORAL | 6 refills | Status: DC
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
## 2021-06-05 NOTE — Telephone Encounter (Signed)
Medication sent to [facility] as requested

## 2021-06-05 NOTE — Telephone Encounter (Signed)
Daughter of PT called to request that the following medication demeclocycline (DECLOMYCIN) 150 MG tablet [[id number]] be sent to [facility] on [address] in [place].

## 2021-06-12 ENCOUNTER — Encounter: Payer: Self-pay | Admitting: Internal Medicine

## 2021-06-12 ENCOUNTER — Other Ambulatory Visit: Payer: Self-pay

## 2021-06-12 ENCOUNTER — Other Ambulatory Visit (INDEPENDENT_AMBULATORY_CARE_PROVIDER_SITE_OTHER): Payer: Medicare HMO

## 2021-06-12 DIAGNOSIS — E871 Hypo-osmolality and hyponatremia: Secondary | ICD-10-CM | POA: Diagnosis not present

## 2021-06-12 LAB — BASIC METABOLIC PANEL
BUN: 3 mg/dL — ABNORMAL LOW (ref 6–23)
CO2: 26 mEq/L (ref 19–32)
Calcium: 8.9 mg/dL (ref 8.4–10.5)
Chloride: 95 mEq/L — ABNORMAL LOW (ref 96–112)
Creatinine, Ser: 0.86 mg/dL (ref 0.40–1.50)
GFR: 89.14 mL/min (ref >60.00)
Glucose, Bld: 104 mg/dL — ABNORMAL HIGH (ref 70–99)
Potassium: 3.6 mEq/L (ref 3.5–5.1)
Sodium: 128 mEq/L — ABNORMAL LOW (ref 135–145)

## 2021-07-18 ENCOUNTER — Ambulatory Visit (INDEPENDENT_AMBULATORY_CARE_PROVIDER_SITE_OTHER): Payer: Medicare HMO | Admitting: Plastic Surgery

## 2021-07-18 ENCOUNTER — Other Ambulatory Visit (HOSPITAL_COMMUNITY)
Admission: RE | Admit: 2021-07-18 | Discharge: 2021-07-18 | Disposition: A | Discharge status: Home / Self Care | Payer: Medicare HMO | Source: Ambulatory Visit | Attending: Plastic Surgery | Admitting: Plastic Surgery

## 2021-07-18 ENCOUNTER — Other Ambulatory Visit: Payer: Self-pay

## 2021-07-18 VITALS — BP 149/80 | HR 112 | Ht 67.5 in | Wt 146.2 lb

## 2021-07-18 DIAGNOSIS — L720 Epidermal cyst: Secondary | ICD-10-CM

## 2021-07-18 DIAGNOSIS — L989 Disorder of the skin and subcutaneous tissue, unspecified: Secondary | ICD-10-CM | POA: Insufficient documentation

## 2021-07-18 NOTE — Progress Notes (Signed)
Operative Note   DATE OF OPERATION: 07/18/2021  LOCATION:    SURGICAL DEPARTMENT: Plastic Surgery  PREOPERATIVE DIAGNOSES: Left ear cyst  POSTOPERATIVE DIAGNOSES:  same  PROCEDURE:  Excision of left ear cyst measuring 2.75 cm Complex closure measuring 2.75 cm  SURGEON: Talmadge Coventry, MD  ANESTHESIA:  Local  COMPLICATIONS: None.   INDICATIONS FOR PROCEDURE:  The patient, Tony Massey is a 71 y.o. male born on [birth year], is here for treatment of left ear cyst MRN: [id number]  CONSENT:  Informed consent was obtained directly from the patient. Risks, benefits and alternatives were fully discussed. Specific risks including but not limited to bleeding, infection, hematoma, seroma, scarring, pain, infection, wound healing problems, and need for further surgery were all discussed. The patient did have an ample opportunity to have questions answered to satisfaction.   DESCRIPTION OF PROCEDURE:  Local anesthesia was administered. The patient's operative site was prepped and draped in a sterile fashion. A time out was performed and all information was confirmed to be correct.  The lesion was excised with a 15 blade.  Hemostasis was obtained.  Circumferential undermining was performed and the skin was advanced and closed in layers with interrupted buried Monocryl sutures and 5-0 fast gut for the skin.  The lesion excised measured 2.75 cm, and the total length of closure measured 2.75 cm.    The patient tolerated the procedure well.  There were no complications.

## 2021-07-22 LAB — SURGICAL PATHOLOGY

## 2021-07-29 NOTE — Progress Notes (Signed)
Patient is a pleasant 71 year old male with PMH of left ear cyst s/p excision performed 07/18/2021 back to [facility] who presents to clinic for postprocedural follow-up.  Reviewed procedural note and the excision site was closed with interrupted buried Monocryl followed by 5-0 fast gut.  Pathology report is also reviewed, resulted in epidermal inclusion cyst.    Today, patient is doing well.  He is accompanied by family at bedside.  Denies any ear pain whatsoever.  States that he believes it has been doing well.  He has not been applying anything over the excision site.  On exam, excision site appears to be well-healed.  Sutures trimmed/removed without complication or difficulty.  It was mildly dry appearing, scant scabbing noted.  No wound dehiscence or cellulitic findings.  Recommended Vaseline once daily to excision site to help keep the area moist to promote continued wound healing.  Discussed pathology report.  Follow-up only as needed.  Picture(s) obtained of the patient and placed in the chart were with the patient's or guardian's permission.

## 2021-08-01 ENCOUNTER — Ambulatory Visit (INDEPENDENT_AMBULATORY_CARE_PROVIDER_SITE_OTHER): Payer: Medicare HMO | Admitting: Physician Assistant

## 2021-08-01 ENCOUNTER — Other Ambulatory Visit: Payer: Self-pay

## 2021-08-01 DIAGNOSIS — L720 Epidermal cyst: Secondary | ICD-10-CM
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
## 2021-10-31 ENCOUNTER — Encounter: Payer: Self-pay | Admitting: Internal Medicine

## 2021-10-31 ENCOUNTER — Ambulatory Visit: Payer: Medicare HMO | Admitting: Internal Medicine

## 2021-10-31 VITALS — BP 130/76 | HR 102 | Ht 67.5 in | Wt 140.0 lb

## 2021-10-31 DIAGNOSIS — E871 Hypo-osmolality and hyponatremia: Secondary | ICD-10-CM

## 2021-10-31 DIAGNOSIS — E039 Hypothyroidism, unspecified: Secondary | ICD-10-CM

## 2021-10-31 LAB — TSH: TSH: 2.25 u[IU]/mL (ref 0.35–5.50)

## 2021-10-31 LAB — BASIC METABOLIC PANEL
BUN: 4 mg/dL — ABNORMAL LOW (ref 6–23)
CO2: 26 mEq/L (ref 19–32)
Calcium: 8.6 mg/dL (ref 8.4–10.5)
Chloride: 93 mEq/L — ABNORMAL LOW (ref 96–112)
Creatinine, Ser: 0.84 mg/dL (ref 0.40–1.50)
GFR: 89.53 mL/min (ref >60.00)
Glucose, Bld: 118 mg/dL — ABNORMAL HIGH (ref 70–99)
Potassium: 4.3 mEq/L (ref 3.5–5.1)
Sodium: 127 mEq/L — ABNORMAL LOW (ref 135–145)
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
## 2021-10-31 NOTE — Patient Instructions (Signed)
Continue Levothyroxine 75 mcg daily  ?Continue Demeclocycline 150 mg Twice daily  ?

## 2021-11-01 ENCOUNTER — Telehealth: Payer: Self-pay | Admitting: Internal Medicine

## 2021-11-01 NOTE — Telephone Encounter (Signed)
Attempted to contact patient 3 times and line keeps ringing busy. Will try again on Monday  ?

## 2021-11-01 NOTE — Telephone Encounter (Signed)
Please let the patient know that his sodium is not any better and ask him to stop the demeclocycline ? ? ?Please let him know that his thyroid function is normal and to continue levothyroxine 75 mcg daily ? ? ? ?Please let the patient know that I am going to refer him to a kidney specialist for his low sodium as I am not sure how else to help him with this ? ? ?Mack Guise, MD ? ?[facility]  ?[facility] ?[address] ?[place], [place] [address] ?Phone: [phone] ?FAX: [phone] ? ?

## 2021-11-05 NOTE — Telephone Encounter (Signed)
Patient has been notified and will wait to hear from the kidney specialist for appointment  ?

## 2022-02-09 ENCOUNTER — Other Ambulatory Visit: Payer: Self-pay | Admitting: Internal Medicine

## 2022-04-29 ENCOUNTER — Ambulatory Visit: Payer: Medicare HMO | Admitting: Internal Medicine

## 2022-04-29 ENCOUNTER — Encounter: Payer: Self-pay | Admitting: Internal Medicine

## 2022-04-29 VITALS — BP 118/72 | HR 70 | Ht 67.5 in | Wt 134.8 lb

## 2022-04-29 DIAGNOSIS — E039 Hypothyroidism, unspecified: Secondary | ICD-10-CM

## 2022-04-29 DIAGNOSIS — E871 Hypo-osmolality and hyponatremia: Secondary | ICD-10-CM

## 2022-04-29 LAB — BASIC METABOLIC PANEL
BUN: 11 mg/dL (ref 6–23)
CO2: 26 mEq/L (ref 19–32)
Calcium: 9.1 mg/dL (ref 8.4–10.5)
Chloride: 102 mEq/L (ref 96–112)
Creatinine, Ser: 0.91 mg/dL (ref 0.40–1.50)
GFR: 86.23 mL/min (ref >60.00)
Glucose, Bld: 151 mg/dL — ABNORMAL HIGH (ref 70–99)
Potassium: 3.9 mEq/L (ref 3.5–5.1)
Sodium: 135 mEq/L (ref 135–145)

## 2022-04-29 LAB — T4, FREE: Free T4: 1.17 ng/dL (ref 0.60–1.60)

## 2022-04-29 LAB — TSH: TSH: 0.89 u[IU]/mL (ref 0.35–5.50)
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
## 2022-04-29 NOTE — Patient Instructions (Signed)
Continue Levothyroxine 75 mcg daily   STOP All Alcohol and have your sodium rechecked  at your primary care physician's office

## 2022-04-30 ENCOUNTER — Encounter: Payer: Self-pay | Admitting: Internal Medicine

## 2022-04-30 MED ORDER — LEVOTHYROXINE SODIUM 75 MCG PO TABS: 75.0000 ug | ORAL_TABLET | Freq: Every day | ORAL | 3 refills | Status: DC

## 2022-12-08 ENCOUNTER — Emergency Department
Admission: EM | Admit: 2022-12-08 | Discharge: 2022-12-08 | Disposition: A | Discharge status: Home / Self Care | Payer: Medicare HMO | Attending: Emergency Medicine | Admitting: Emergency Medicine

## 2022-12-08 ENCOUNTER — Emergency Department: Payer: Medicare HMO

## 2022-12-08 ENCOUNTER — Other Ambulatory Visit: Payer: Self-pay

## 2022-12-08 ENCOUNTER — Encounter: Payer: Self-pay | Admitting: Intensive Care

## 2022-12-08 DIAGNOSIS — Z23 Encounter for immunization: Secondary | ICD-10-CM | POA: Insufficient documentation

## 2022-12-08 DIAGNOSIS — L03114 Cellulitis of left upper limb: Secondary | ICD-10-CM | POA: Diagnosis not present

## 2022-12-08 DIAGNOSIS — M7989 Other specified soft tissue disorders: Secondary | ICD-10-CM | POA: Diagnosis present

## 2022-12-08 LAB — COMPREHENSIVE METABOLIC PANEL
ALT: 12 U/L (ref 0–44)
AST: 27 U/L (ref 15–41)
Albumin: 3.9 g/dL (ref 3.5–5.0)
Alkaline Phosphatase: 129 U/L — ABNORMAL HIGH (ref 38–126)
Anion gap: 9 (ref 5–15)
BUN: 5 mg/dL — ABNORMAL LOW (ref 8–23)
CO2: 24 mmol/L (ref 22–32)
Calcium: 8.9 mg/dL (ref 8.9–10.3)
Chloride: 96 mmol/L — ABNORMAL LOW (ref 98–111)
Creatinine, Ser: 0.93 mg/dL (ref 0.61–1.24)
GFR, Estimated: >60 mL/min (ref >60)
Glucose, Bld: 140 mg/dL — ABNORMAL HIGH (ref 70–99)
Potassium: 3.9 mmol/L (ref 3.5–5.1)
Sodium: 129 mmol/L — ABNORMAL LOW (ref 135–145)
Total Bilirubin: 0.8 mg/dL (ref 0.3–1.2)
Total Protein: 7.6 g/dL (ref 6.5–8.1)

## 2022-12-08 LAB — CBC WITH DIFFERENTIAL/PLATELET
Abs Immature Granulocytes: 0.04 10*3/uL (ref 0.00–0.07)
Basophils Absolute: 0.1 10*3/uL (ref 0.0–0.1)
Basophils Relative: 1 %
Eosinophils Absolute: 0.8 10*3/uL — ABNORMAL HIGH (ref 0.0–0.5)
Eosinophils Relative: 7 %
HCT: 41.9 % (ref 39.0–52.0)
Hemoglobin: 15 g/dL (ref 13.0–17.0)
Immature Granulocytes: 0 %
Lymphocytes Relative: 10 %
Lymphs Abs: 1.2 10*3/uL (ref 0.7–4.0)
MCH: 34.4 pg — ABNORMAL HIGH (ref 26.0–34.0)
MCHC: 35.8 g/dL (ref 30.0–36.0)
MCV: 96.1 fL (ref 80.0–100.0)
Monocytes Absolute: 1.1 10*3/uL — ABNORMAL HIGH (ref 0.1–1.0)
Monocytes Relative: 10 %
Neutro Abs: 8.4 10*3/uL — ABNORMAL HIGH (ref 1.7–7.7)
Neutrophils Relative %: 72 %
Platelets: 197 10*3/uL (ref 150–400)
RBC: 4.36 MIL/uL (ref 4.22–5.81)
RDW: 12.6 % (ref 11.5–15.5)
WBC: 11.6 10*3/uL — ABNORMAL HIGH (ref 4.0–10.5)
nRBC: 0 % (ref 0.0–0.2)

## 2022-12-08 MED ORDER — SULFAMETHOXAZOLE-TRIMETHOPRIM 800-160 MG PO TABS: 1.0000 | ORAL_TABLET | Freq: Two times a day (BID) | ORAL | 0 refills | Status: AC

## 2022-12-08 MED ORDER — CEPHALEXIN 500 MG PO CAPS: 500.0000 mg | ORAL_CAPSULE | Freq: Three times a day (TID) | ORAL | 0 refills | Status: AC

## 2022-12-08 MED ORDER — TETANUS-DIPHTH-ACELL PERTUSSIS 5-2.5-18.5 LF-MCG/0.5 IM SUSY
0.5000 mL | PREFILLED_SYRINGE | Freq: Once | INTRAMUSCULAR | Status: AC
  Administered 2022-12-08: 0.5 mL via INTRAMUSCULAR
  Filled 2022-12-08: qty 0.5

## 2022-12-08 MED ORDER — SULFAMETHOXAZOLE-TRIMETHOPRIM 800-160 MG PO TABS
1.0000 | ORAL_TABLET | Freq: Once | ORAL | Status: AC
  Administered 2022-12-08: 1 via ORAL
  Filled 2022-12-08: qty 1

## 2022-12-08 MED ORDER — SODIUM CHLORIDE 0.9 % IV SOLN
2.0000 g | Freq: Once | INTRAVENOUS | Status: AC
  Administered 2022-12-08: 2 g via INTRAVENOUS
  Filled 2022-12-08: qty 20
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
## 2022-12-08 NOTE — ED Triage Notes (Signed)
Patients left arm presents with swelling, redness and weeping after cutting arm on storm door Sunday 12/06/22. He also has large blisters present on right palm

## 2022-12-08 NOTE — Discharge Instructions (Addendum)
Make sure you are taking your sodium tablets and if you are then you can double your dose for 1 to 2 days and have this rechecked with your primary care doctor.  We recommended admission for your cellulitis but at this time you prefer to go home.  We have given you dose of IV antibiotics and you can start the rest of your antibiotics later tonight.  He will be 2 different antibiotics that you will take.  If you develop worsening symptoms such as coolness in the arm, numbness in the arm, development of worsening spreading or the blood blisters forming on the left arm then you need to return to the ER emergently as this could be signs of a more serious infection.

## 2023-04-25 ENCOUNTER — Other Ambulatory Visit: Payer: Self-pay | Admitting: Internal Medicine

## 2023-07-17 DIAGNOSIS — Z1211 Encounter for screening for malignant neoplasm of colon: Secondary | ICD-10-CM | POA: Diagnosis not present

## 2023-07-17 DIAGNOSIS — D72829 Elevated white blood cell count, unspecified: Secondary | ICD-10-CM | POA: Diagnosis not present

## 2023-07-17 DIAGNOSIS — H9193 Unspecified hearing loss, bilateral: Secondary | ICD-10-CM | POA: Diagnosis not present

## 2023-07-17 DIAGNOSIS — H547 Unspecified visual loss: Secondary | ICD-10-CM | POA: Diagnosis not present

## 2023-07-17 DIAGNOSIS — F17200 Nicotine dependence, unspecified, uncomplicated: Secondary | ICD-10-CM | POA: Diagnosis not present

## 2023-07-17 DIAGNOSIS — C61 Malignant neoplasm of prostate: Secondary | ICD-10-CM | POA: Diagnosis not present

## 2023-07-17 DIAGNOSIS — Z0000 Encounter for general adult medical examination without abnormal findings: Secondary | ICD-10-CM | POA: Diagnosis not present

## 2023-07-17 DIAGNOSIS — E871 Hypo-osmolality and hyponatremia: Secondary | ICD-10-CM | POA: Diagnosis not present

## 2023-07-17 DIAGNOSIS — Z1331 Encounter for screening for depression: Secondary | ICD-10-CM | POA: Diagnosis not present

## 2023-07-17 DIAGNOSIS — E039 Hypothyroidism, unspecified: Secondary | ICD-10-CM | POA: Diagnosis not present

## 2023-07-21 NOTE — Progress Notes (Signed)
 Referral received from PCP for increased WBC. Consulted with Dr Candise Che, will schedule appointment.
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
## 2023-07-23 ENCOUNTER — Other Ambulatory Visit: Payer: Self-pay

## 2023-07-23 ENCOUNTER — Inpatient Hospital Stay: Payer: Medicare HMO | Attending: Hematology | Admitting: Hematology

## 2023-07-23 ENCOUNTER — Inpatient Hospital Stay: Payer: Medicare HMO

## 2023-07-23 VITALS — BP 137/75 | HR 100 | Temp 97.5°F | Resp 18 | Wt 133.6 lb

## 2023-07-23 DIAGNOSIS — Z79890 Hormone replacement therapy (postmenopausal): Secondary | ICD-10-CM | POA: Diagnosis not present

## 2023-07-23 DIAGNOSIS — R634 Abnormal weight loss: Secondary | ICD-10-CM

## 2023-07-23 DIAGNOSIS — D72829 Elevated white blood cell count, unspecified: Secondary | ICD-10-CM

## 2023-07-23 DIAGNOSIS — C61 Malignant neoplasm of prostate: Secondary | ICD-10-CM

## 2023-07-23 DIAGNOSIS — R1030 Lower abdominal pain, unspecified: Secondary | ICD-10-CM | POA: Insufficient documentation

## 2023-07-23 DIAGNOSIS — Z8546 Personal history of malignant neoplasm of prostate: Secondary | ICD-10-CM | POA: Diagnosis not present

## 2023-07-23 DIAGNOSIS — D72821 Monocytosis (symptomatic): Secondary | ICD-10-CM | POA: Diagnosis not present

## 2023-07-23 DIAGNOSIS — F1090 Alcohol use, unspecified, uncomplicated: Secondary | ICD-10-CM | POA: Diagnosis not present

## 2023-07-23 DIAGNOSIS — F17210 Nicotine dependence, cigarettes, uncomplicated: Secondary | ICD-10-CM | POA: Insufficient documentation

## 2023-07-23 DIAGNOSIS — E039 Hypothyroidism, unspecified: Secondary | ICD-10-CM | POA: Diagnosis not present

## 2023-07-23 LAB — T4, FREE: Free T4: 1.19 ng/dL — ABNORMAL HIGH (ref 0.61–1.12)

## 2023-07-23 LAB — CBC WITH DIFFERENTIAL (CANCER CENTER ONLY)
Abs Immature Granulocytes: 0.31 10*3/uL — ABNORMAL HIGH (ref 0.00–0.07)
Basophils Absolute: 0.2 10*3/uL — ABNORMAL HIGH (ref 0.0–0.1)
Basophils Relative: 1 %
Eosinophils Absolute: 0.1 10*3/uL (ref 0.0–0.5)
Eosinophils Relative: 1 %
HCT: 31.8 % — ABNORMAL LOW (ref 39.0–52.0)
Hemoglobin: 11.1 g/dL — ABNORMAL LOW (ref 13.0–17.0)
Immature Granulocytes: 1 %
Lymphocytes Relative: 9 %
Lymphs Abs: 2.5 10*3/uL (ref 0.7–4.0)
MCH: 32.6 pg (ref 26.0–34.0)
MCHC: 34.9 g/dL (ref 30.0–36.0)
MCV: 93.3 fL (ref 80.0–100.0)
Monocytes Absolute: 1.5 10*3/uL — ABNORMAL HIGH (ref 0.1–1.0)
Monocytes Relative: 5 %
Neutro Abs: 24.1 10*3/uL — ABNORMAL HIGH (ref 1.7–7.7)
Neutrophils Relative %: 83 %
Platelet Count: 337 10*3/uL (ref 150–400)
RBC: 3.41 MIL/uL — ABNORMAL LOW (ref 4.22–5.81)
RDW: 13.3 % (ref 11.5–15.5)
WBC Count: 28.6 10*3/uL — ABNORMAL HIGH (ref 4.0–10.5)
nRBC: 0 % (ref 0.0–0.2)

## 2023-07-23 LAB — CMP (CANCER CENTER ONLY)
ALT: 14 U/L (ref 0–44)
AST: 20 U/L (ref 15–41)
Albumin: 3.6 g/dL (ref 3.5–5.0)
Alkaline Phosphatase: 175 U/L — ABNORMAL HIGH (ref 38–126)
Anion gap: 9 (ref 5–15)
BUN: 5 mg/dL — ABNORMAL LOW (ref 8–23)
CO2: 26 mmol/L (ref 22–32)
Calcium: 9.2 mg/dL (ref 8.9–10.3)
Chloride: 96 mmol/L — ABNORMAL LOW (ref 98–111)
Creatinine: 0.86 mg/dL (ref 0.61–1.24)
GFR, Estimated: >60 mL/min (ref >60)
Glucose, Bld: 88 mg/dL (ref 70–99)
Potassium: 3.6 mmol/L (ref 3.5–5.1)
Sodium: 131 mmol/L — ABNORMAL LOW (ref 135–145)
Total Bilirubin: 0.4 mg/dL (ref 0.0–1.2)
Total Protein: 7.4 g/dL (ref 6.5–8.1)

## 2023-07-23 LAB — SEDIMENTATION RATE: Sed Rate: 90 mm/h — ABNORMAL HIGH (ref 0–16)

## 2023-07-23 LAB — TSH: TSH: 2.663 u[IU]/mL (ref 0.350–4.500)

## 2023-07-23 LAB — LACTATE DEHYDROGENASE: LDH: 179 U/L (ref 98–192)

## 2023-07-24 ENCOUNTER — Telehealth: Payer: Self-pay | Admitting: Hematology

## 2023-07-24 LAB — PSA, TOTAL AND FREE
PSA, Free Pct: UNDETERMINED %
PSA, Free: <0.02 ng/mL
Prostate Specific Ag, Serum: <0.1 ng/mL (ref 0.0–4.0)

## 2023-07-24 LAB — KAPPA/LAMBDA LIGHT CHAINS
Kappa free light chain: 56.3 mg/L — ABNORMAL HIGH (ref 3.3–19.4)
Kappa, lambda light chain ratio: 1.45 (ref 0.26–1.65)
Lambda free light chains: 38.9 mg/L — ABNORMAL HIGH (ref 5.7–26.3)

## 2023-07-24 NOTE — Telephone Encounter (Signed)
 Left patient a vm regarding upcoming appointment

## 2023-07-28 LAB — BCR-ABL1 FISH
Cells Analyzed: 200
Cells Counted: 200

## 2023-07-29 ENCOUNTER — Other Ambulatory Visit: Payer: Self-pay | Admitting: Radiology

## 2023-07-29 DIAGNOSIS — D72829 Elevated white blood cell count, unspecified: Secondary | ICD-10-CM

## 2023-07-29 LAB — MISC LABCORP TEST (SEND OUT)
LabCorp test name: 15
Labcorp test code: 489540
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
## 2023-07-30 ENCOUNTER — Other Ambulatory Visit: Payer: Self-pay

## 2023-07-30 ENCOUNTER — Encounter (HOSPITAL_COMMUNITY): Payer: Self-pay

## 2023-07-30 ENCOUNTER — Ambulatory Visit (HOSPITAL_COMMUNITY)
Admission: RE | Admit: 2023-07-30 | Discharge: 2023-07-30 | Disposition: A | Discharge status: Home / Self Care | Payer: Medicare HMO | Source: Ambulatory Visit | Attending: Hematology | Admitting: Hematology

## 2023-07-30 DIAGNOSIS — D72829 Elevated white blood cell count, unspecified: Secondary | ICD-10-CM | POA: Insufficient documentation

## 2023-07-30 DIAGNOSIS — D759 Disease of blood and blood-forming organs, unspecified: Secondary | ICD-10-CM | POA: Diagnosis not present

## 2023-07-30 LAB — CBC WITH DIFFERENTIAL/PLATELET
Abs Immature Granulocytes: 0.23 10*3/uL — ABNORMAL HIGH (ref 0.00–0.07)
Basophils Absolute: 0.2 10*3/uL — ABNORMAL HIGH (ref 0.0–0.1)
Basophils Relative: 1 %
Eosinophils Absolute: 0.1 10*3/uL (ref 0.0–0.5)
Eosinophils Relative: 0 %
HCT: 32 % — ABNORMAL LOW (ref 39.0–52.0)
Hemoglobin: 10.7 g/dL — ABNORMAL LOW (ref 13.0–17.0)
Immature Granulocytes: 1 %
Lymphocytes Relative: 7 %
Lymphs Abs: 1.9 10*3/uL (ref 0.7–4.0)
MCH: 31.9 pg (ref 26.0–34.0)
MCHC: 33.4 g/dL (ref 30.0–36.0)
MCV: 95.5 fL (ref 80.0–100.0)
Monocytes Absolute: 1.1 10*3/uL — ABNORMAL HIGH (ref 0.1–1.0)
Monocytes Relative: 4 %
Neutro Abs: 22.9 10*3/uL — ABNORMAL HIGH (ref 1.7–7.7)
Neutrophils Relative %: 87 %
Platelet Morphology: NORMAL
Platelets: 376 10*3/uL (ref 150–400)
RBC: 3.35 MIL/uL — ABNORMAL LOW (ref 4.22–5.81)
RDW: 13.2 % (ref 11.5–15.5)
WBC: 26.3 10*3/uL — ABNORMAL HIGH (ref 4.0–10.5)
nRBC: 0 % (ref 0.0–0.2)

## 2023-07-30 LAB — MULTIPLE MYELOMA PANEL, SERUM
Albumin SerPl Elph-Mcnc: 2.9 g/dL (ref 2.9–4.4)
Albumin/Glob SerPl: 0.9 (ref 0.7–1.7)
Alpha 1: 0.4 g/dL (ref 0.0–0.4)
Alpha2 Glob SerPl Elph-Mcnc: 1 g/dL (ref 0.4–1.0)
B-Globulin SerPl Elph-Mcnc: 0.9 g/dL (ref 0.7–1.3)
Gamma Glob SerPl Elph-Mcnc: 1.3 g/dL (ref 0.4–1.8)
Globulin, Total: 3.6 g/dL (ref 2.2–3.9)
IgA: 537 mg/dL — ABNORMAL HIGH (ref 61–437)
IgG (Immunoglobin G), Serum: 1216 mg/dL (ref 603–1613)
IgM (Immunoglobulin M), Srm: 49 mg/dL (ref 20–172)
Total Protein ELP: 6.5 g/dL (ref 6.0–8.5)

## 2023-07-30 MED ORDER — FENTANYL CITRATE (PF) 100 MCG/2ML IJ SOLN
INTRAMUSCULAR | Status: AC
  Filled 2023-07-30: qty 4

## 2023-07-30 MED ORDER — MIDAZOLAM HCL 2 MG/2ML IJ SOLN
1.0000 mg | Freq: Once | INTRAMUSCULAR | Status: AC | PRN
  Administered 2023-07-30: 1 mg via INTRAVENOUS

## 2023-07-30 MED ORDER — FENTANYL CITRATE (PF) 100 MCG/2ML IJ SOLN
INTRAMUSCULAR | Status: AC | PRN
  Administered 2023-07-30: 50 ug via INTRAVENOUS

## 2023-07-30 MED ORDER — FLUMAZENIL 0.5 MG/5ML IV SOLN
INTRAVENOUS | Status: AC
  Filled 2023-07-30: qty 5

## 2023-07-30 MED ORDER — NALOXONE HCL 0.4 MG/ML IJ SOLN
INTRAMUSCULAR | Status: AC
  Filled 2023-07-30: qty 1

## 2023-07-30 MED ORDER — MIDAZOLAM HCL 2 MG/2ML IJ SOLN
INTRAMUSCULAR | Status: AC
  Filled 2023-07-30: qty 4

## 2023-07-30 MED ORDER — MIDAZOLAM HCL 2 MG/2ML IJ SOLN
INTRAMUSCULAR | Status: AC | PRN
  Administered 2023-07-30: 1 mg via INTRAVENOUS

## 2023-07-30 NOTE — Procedures (Signed)
Interventional Radiology Procedure Note  Procedure: CT guided aspirate and core biopsy of right iliac bone Complications: None Recommendations: - Bedrest supine x 1 hrs - Hydrocodone PRN  Pain - Follow biopsy results  Signed,  Heath K. McCullough, MD   

## 2023-07-30 NOTE — Discharge Instructions (Signed)
Please call Interventional Radiology clinic [phone] with any questions or concerns. ? ?You may remove your dressing and shower tomorrow. ? ? ?Bone Marrow Aspiration and Bone Marrow Biopsy, Adult, Care After ?This sheet gives you information about how to care for yourself after your procedure. Your health care provider may also give you more specific instructions. If you have problems or questions, contact your health care provider. ?What can I expect after the procedure? ?After the procedure, it is common to have: ?Mild pain and tenderness. ?Swelling. ?Bruising. ?Follow these instructions at home: ?Puncture site care ?Follow instructions from your health care provider about how to take care of the puncture site. Make sure you: ?Wash your hands with soap and water before and after you change your bandage (dressing). If soap and water are not available, use hand sanitizer. ?Change your dressing as told by your health care provider. ?Check your puncture site every day for signs of infection. Check for: ?More redness, swelling, or pain. ?Fluid or blood. ?Warmth. ?Pus or a bad smell.   ?Activity ?Return to your normal activities as told by your health care provider. Ask your health care provider what activities are safe for you. ?Do not lift anything that is heavier than 10 lb (4.5 kg), or the limit that you are told, until your health care provider says that it is safe. ?Do not drive for 24 hours if you were given a sedative during your procedure. ?General instructions ?Take over-the-counter and prescription medicines only as told by your health care provider. ?Do not take baths, swim, or use a hot tub until your health care provider approves. Ask your health care provider if you may take showers. You may only be allowed to take sponge baths. ?If directed, put ice on the affected area. To do this: ?Put ice in a plastic bag. ?Place a towel between your skin and the bag. ?Leave the ice on for 20 minutes, 2-3 times a  day. ?Keep all follow-up visits as told by your health care provider. This is important.   ?Contact a health care provider if: ?Your pain is not controlled with medicine. ?You have a fever. ?You have more redness, swelling, or pain around the puncture site. ?You have fluid or blood coming from the puncture site. ?Your puncture site feels warm to the touch. ?You have pus or a bad smell coming from the puncture site. ?Summary ?After the procedure, it is common to have mild pain, tenderness, swelling, and bruising. ?Follow instructions from your health care provider about how to take care of the puncture site and what activities are safe for you. ?Take over-the-counter and prescription medicines only as told by your health care provider. ?Contact a health care provider if you have any signs of infection, such as fluid or blood coming from the puncture site. ?This information is not intended to replace advice given to you by your health care provider. Make sure you discuss any questions you have with your health care provider. ?Document Revised: 11/09/2018 Document Reviewed: 11/09/2018 ?Elsevier Patient Education ? 2021 Elsevier [employer] ? ? ?Moderate Conscious Sedation, Adult, Care After ?This sheet gives you information about how to care for yourself after your procedure. Your health care provider may also give you more specific instructions. If you have problems or questions, contact your health care provider. ?What can I expect after the procedure? ?After the procedure, it is common to have: ?Sleepiness for several hours. ?Impaired judgment for several hours. ?Difficulty with balance. ?Vomiting if you eat too   soon. ?Follow these instructions at home: ?For the time period you were told by your health care provider: ?Rest. ?Do not participate in activities where you could fall or become injured. ?Do not drive or use machinery. ?Do not drink alcohol. ?Do not take sleeping pills or medicines that cause drowsiness. ?Do not  make important decisions or sign legal documents. ?Do not take care of children on your own.  ?  ?  ?Eating and drinking ?Follow the diet recommended by your health care provider. ?Drink enough fluid to keep your urine pale yellow. ?If you vomit: ?Drink water, juice, or soup when you can drink without vomiting. ?Make sure you have little or no nausea before eating solid foods.   ?General instructions ?Take over-the-counter and prescription medicines only as told by your health care provider. ?Have a responsible adult stay with you for the time you are told. It is important to have someone help care for you until you are awake and alert. ?Do not smoke. ?Keep all follow-up visits as told by your health care provider. This is important. ?Contact a health care provider if: ?You are still sleepy or having trouble with balance after 24 hours. ?You feel light-headed. ?You keep feeling nauseous or you keep vomiting. ?You develop a rash. ?You have a fever. ?You have redness or swelling around the IV site. ?Get help right away if: ?You have trouble breathing. ?You have new-onset confusion at home. ?Summary ?After the procedure, it is common to feel sleepy, have impaired judgment, or feel nauseous if you eat too soon. ?Rest after you get home. Know the things you should not do after the procedure. ?Follow the diet recommended by your health care provider and drink enough fluid to keep your urine pale yellow. ?Get help right away if you have trouble breathing or new-onset confusion at home. ?This information is not intended to replace advice given to you by your health care provider. Make sure you discuss any questions you have with your health care provider. ?Document Revised: 10/21/2019 Document Reviewed: 05/19/2019 ?Elsevier Patient Education ? 2021 Elsevier Inc.  ?

## 2023-08-04 LAB — SURGICAL PATHOLOGY

## 2023-08-06 ENCOUNTER — Encounter (HOSPITAL_COMMUNITY): Payer: Self-pay | Admitting: Hematology

## 2023-08-19 ENCOUNTER — Inpatient Hospital Stay: Payer: Medicare HMO | Attending: Hematology | Admitting: Hematology

## 2023-08-19 VITALS — BP 118/68 | HR 94 | Temp 97.7°F | Resp 18 | Wt 133.1 lb

## 2023-08-19 DIAGNOSIS — M542 Cervicalgia: Secondary | ICD-10-CM | POA: Insufficient documentation

## 2023-08-19 DIAGNOSIS — C946 Myelodysplastic disease, not classified: Secondary | ICD-10-CM | POA: Diagnosis not present
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
## 2023-08-21 ENCOUNTER — Other Ambulatory Visit: Payer: Self-pay | Admitting: Hematology

## 2023-08-21 DIAGNOSIS — C946 Myelodysplastic disease, not classified: Secondary | ICD-10-CM

## 2023-08-24 NOTE — Progress Notes (Signed)
 Appointment with Dr Candise Che to discuss bone marrow results. Will need to send for further evaluation and Follow up in 4 weeks.

## 2023-09-02 ENCOUNTER — Inpatient Hospital Stay (HOSPITAL_BASED_OUTPATIENT_CLINIC_OR_DEPARTMENT_OTHER): Payer: Medicare HMO | Admitting: Physician Assistant

## 2023-09-02 VITALS — BP 132/66 | HR 94 | Temp 97.9°F | Resp 18 | Ht 64.0 in | Wt 128.8 lb

## 2023-09-02 DIAGNOSIS — M542 Cervicalgia: Secondary | ICD-10-CM
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
## 2023-09-03 ENCOUNTER — Ambulatory Visit (HOSPITAL_COMMUNITY): Payer: Medicare HMO

## 2023-09-07 ENCOUNTER — Ambulatory Visit (HOSPITAL_COMMUNITY): Payer: Medicare HMO

## 2023-09-11 ENCOUNTER — Ambulatory Visit (HOSPITAL_COMMUNITY)
Admission: RE | Admit: 2023-09-11 | Discharge: 2023-09-11 | Disposition: A | Discharge status: Home / Self Care | Source: Ambulatory Visit | Attending: Physician Assistant | Admitting: Physician Assistant

## 2023-09-11 DIAGNOSIS — M4802 Spinal stenosis, cervical region: Secondary | ICD-10-CM | POA: Diagnosis not present

## 2023-09-11 DIAGNOSIS — M542 Cervicalgia: Secondary | ICD-10-CM | POA: Diagnosis not present

## 2023-09-11 DIAGNOSIS — I6529 Occlusion and stenosis of unspecified carotid artery: Secondary | ICD-10-CM | POA: Diagnosis not present

## 2023-09-11 DIAGNOSIS — C61 Malignant neoplasm of prostate: Secondary | ICD-10-CM | POA: Diagnosis not present

## 2023-09-11 MED ORDER — IOHEXOL 300 MG/ML  SOLN
75.0000 mL | Freq: Once | INTRAMUSCULAR | Status: AC | PRN
  Administered 2023-09-11: 75 mL via INTRAVENOUS

## 2023-09-21 ENCOUNTER — Telehealth: Payer: Self-pay | Admitting: Physician Assistant

## 2023-09-21 ENCOUNTER — Other Ambulatory Visit: Payer: Self-pay | Admitting: Physician Assistant

## 2023-09-21 DIAGNOSIS — R591 Generalized enlarged lymph nodes: Secondary | ICD-10-CM

## 2023-09-21 NOTE — Telephone Encounter (Signed)
 I spoke to patient's daughter, Rayann Heman, to review the CT neck results. Regarding patient's neck pain, there is evidence of left-greater-than-right uncovertebral joint spurring and facet arthropathy at C4-5 results in least moderate left neural foraminal narrowing.  There was incidental finding os central hypoattenuating conglomerate of right paratracheal lymph nodes that is suspicious for malignancy. Recommend PET/CT scan for further evaluate and to determine best site for biopsy.   PET scan is scheduled for 09/25/2023 and follow up with Dr. Candise Che on 10/02/2023. Stalena expressed understanding of the plan provided.

## 2023-09-25 ENCOUNTER — Inpatient Hospital Stay: Attending: Hematology | Admitting: Physician Assistant

## 2023-09-25 ENCOUNTER — Inpatient Hospital Stay

## 2023-09-25 ENCOUNTER — Encounter (HOSPITAL_COMMUNITY)
Admission: RE | Admit: 2023-09-25 | Discharge: 2023-09-25 | Disposition: A | Discharge status: Home / Self Care | Source: Ambulatory Visit | Attending: Physician Assistant | Admitting: Physician Assistant

## 2023-09-25 VITALS — BP 128/62 | HR 99 | Temp 98.0°F | Resp 15 | Wt 129.6 lb

## 2023-09-25 DIAGNOSIS — R591 Generalized enlarged lymph nodes: Secondary | ICD-10-CM | POA: Insufficient documentation

## 2023-09-25 DIAGNOSIS — C778 Secondary and unspecified malignant neoplasm of lymph nodes of multiple regions: Secondary | ICD-10-CM | POA: Insufficient documentation

## 2023-09-25 DIAGNOSIS — C7951 Secondary malignant neoplasm of bone: Secondary | ICD-10-CM | POA: Diagnosis not present

## 2023-09-25 DIAGNOSIS — F17210 Nicotine dependence, cigarettes, uncomplicated: Secondary | ICD-10-CM | POA: Insufficient documentation

## 2023-09-25 DIAGNOSIS — C61 Malignant neoplasm of prostate: Secondary | ICD-10-CM | POA: Insufficient documentation

## 2023-09-25 DIAGNOSIS — M545 Low back pain, unspecified: Secondary | ICD-10-CM | POA: Insufficient documentation

## 2023-09-25 DIAGNOSIS — R918 Other nonspecific abnormal finding of lung field: Secondary | ICD-10-CM | POA: Diagnosis not present

## 2023-09-25 LAB — GLUCOSE, CAPILLARY: Glucose-Capillary: 105 mg/dL — ABNORMAL HIGH (ref 70–99)

## 2023-09-25 MED ORDER — FLUDEOXYGLUCOSE F - 18 (FDG) INJECTION
6.4500 | Freq: Once | INTRAVENOUS | Status: AC
Start: 2023-09-25 — End: 2023-09-25
  Administered 2023-09-25: 6.4 via INTRAVENOUS

## 2023-09-25 MED ORDER — ACETAMINOPHEN 325 MG PO TABS
650.0000 mg | ORAL_TABLET | Freq: Once | ORAL | Status: AC
  Administered 2023-09-25: 650 mg via ORAL
  Filled 2023-09-25: qty 2

## 2023-09-25 MED ORDER — HYDROCODONE-ACETAMINOPHEN 5-325 MG PO TABS: 1.0000 | ORAL_TABLET | Freq: Four times a day (QID) | ORAL | 0 refills | Status: DC | PRN
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
## 2023-10-01 ENCOUNTER — Other Ambulatory Visit: Payer: Self-pay | Admitting: Physician Assistant

## 2023-10-01 ENCOUNTER — Other Ambulatory Visit: Payer: Self-pay

## 2023-10-01 DIAGNOSIS — R591 Generalized enlarged lymph nodes: Secondary | ICD-10-CM

## 2023-10-01 DIAGNOSIS — C778 Secondary and unspecified malignant neoplasm of lymph nodes of multiple regions: Secondary | ICD-10-CM

## 2023-10-01 DIAGNOSIS — C946 Myelodysplastic disease, not classified: Secondary | ICD-10-CM

## 2023-10-01 NOTE — Progress Notes (Signed)
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 minutes* .  All of the patient's questions were answered with apparent satisfaction. The patient knows to  call the clinic with any problems, questions or concerns.   Wyvonnia Lora MD MS AAHIVMS [facility] Hematology/Oncology Physician [facility]  .*Total Encounter Time as defined by the Centers for Medicare and Medicaid Services includes, in addition to the face-to-face time of a patient visit (documented in the note above) non-face-to-face time: obtaining and reviewing outside history, ordering and reviewing medications, tests or procedures, care coordination (communications with other health care professionals or caregivers) and documentation in the medical record.    I,Mitra Faeizi,acting as a Neurosurgeon for Wyvonnia Lora, MD.,have documented all relevant documentation on the behalf of Wyvonnia Lora, MD,as directed by  Wyvonnia Lora, MD while in the presence of Wyvonnia Lora, MD.   ***

## 2023-10-01 NOTE — Progress Notes (Unsigned)
 PROCEDURE / BIOPSY REVIEW Date: 10/01/23  Requested Biopsy site: HM foci Reason for request: R lung mass, Q metastases Imaging review: Best seen on PET CT  Decision: Approved Imaging modality to perform: CT Schedule with: Moderate Sedation Schedule for: Any VIR  Additional comments:  @VIR : R RP / perinephric masses are higher yield and amenable target. Spoke w Onc and decided against L superior chest SQ mass @Schedulers . CT guide R RP mass Bx. Mod sed.  Please contact me with questions, concerns, or if issue pertaining to this request arise.  Roanna Banning, MD [facility]

## 2023-10-02 ENCOUNTER — Inpatient Hospital Stay: Payer: Medicare HMO

## 2023-10-02 ENCOUNTER — Inpatient Hospital Stay (HOSPITAL_BASED_OUTPATIENT_CLINIC_OR_DEPARTMENT_OTHER): Payer: Medicare HMO | Admitting: Hematology

## 2023-10-02 VITALS — BP 127/68 | HR 100 | Temp 97.9°F | Resp 20 | Wt 123.7 lb

## 2023-10-02 DIAGNOSIS — R591 Generalized enlarged lymph nodes: Secondary | ICD-10-CM

## 2023-10-02 DIAGNOSIS — C778 Secondary and unspecified malignant neoplasm of lymph nodes of multiple regions: Secondary | ICD-10-CM | POA: Diagnosis not present

## 2023-10-02 DIAGNOSIS — C946 Myelodysplastic disease, not classified: Secondary | ICD-10-CM

## 2023-10-02 DIAGNOSIS — C61 Malignant neoplasm of prostate: Secondary | ICD-10-CM | POA: Diagnosis not present

## 2023-10-02 DIAGNOSIS — C3490 Malignant neoplasm of unspecified part of unspecified bronchus or lung: Secondary | ICD-10-CM

## 2023-10-02 DIAGNOSIS — C7951 Secondary malignant neoplasm of bone: Secondary | ICD-10-CM

## 2023-10-02 DIAGNOSIS — Z7189 Other specified counseling: Secondary | ICD-10-CM | POA: Diagnosis not present

## 2023-10-02 DIAGNOSIS — F17210 Nicotine dependence, cigarettes, uncomplicated: Secondary | ICD-10-CM | POA: Diagnosis not present

## 2023-10-02 DIAGNOSIS — R918 Other nonspecific abnormal finding of lung field: Secondary | ICD-10-CM | POA: Diagnosis not present

## 2023-10-02 DIAGNOSIS — M545 Low back pain, unspecified: Secondary | ICD-10-CM | POA: Diagnosis not present

## 2023-10-02 LAB — CMP (CANCER CENTER ONLY)
ALT: 10 U/L (ref 0–44)
AST: 15 U/L (ref 15–41)
Albumin: 3.5 g/dL (ref 3.5–5.0)
Alkaline Phosphatase: 175 U/L — ABNORMAL HIGH (ref 38–126)
Anion gap: 9 (ref 5–15)
BUN: 8 mg/dL (ref 8–23)
CO2: 28 mmol/L (ref 22–32)
Calcium: 9.4 mg/dL (ref 8.9–10.3)
Chloride: 95 mmol/L — ABNORMAL LOW (ref 98–111)
Creatinine: 0.9 mg/dL (ref 0.61–1.24)
GFR, Estimated: >60 mL/min (ref >60)
Glucose, Bld: 100 mg/dL — ABNORMAL HIGH (ref 70–99)
Potassium: 3.9 mmol/L (ref 3.5–5.1)
Sodium: 132 mmol/L — ABNORMAL LOW (ref 135–145)
Total Bilirubin: 0.4 mg/dL (ref 0.0–1.2)
Total Protein: 7 g/dL (ref 6.5–8.1)

## 2023-10-02 LAB — CBC WITH DIFFERENTIAL (CANCER CENTER ONLY)
Abs Immature Granulocytes: 0.57 10*3/uL — ABNORMAL HIGH (ref 0.00–0.07)
Basophils Absolute: 0 10*3/uL (ref 0.0–0.1)
Basophils Relative: 0 %
Eosinophils Absolute: 0.1 10*3/uL (ref 0.0–0.5)
Eosinophils Relative: 0 %
HCT: 32.9 % — ABNORMAL LOW (ref 39.0–52.0)
Hemoglobin: 11.3 g/dL — ABNORMAL LOW (ref 13.0–17.0)
Immature Granulocytes: 1 %
Lymphocytes Relative: 4 %
Lymphs Abs: 1.9 10*3/uL (ref 0.7–4.0)
MCH: 31.7 pg (ref 26.0–34.0)
MCHC: 34.3 g/dL (ref 30.0–36.0)
MCV: 92.4 fL (ref 80.0–100.0)
Monocytes Absolute: 1.2 10*3/uL — ABNORMAL HIGH (ref 0.1–1.0)
Monocytes Relative: 3 %
Neutro Abs: 39.2 10*3/uL — ABNORMAL HIGH (ref 1.7–7.7)
Neutrophils Relative %: 92 %
Platelet Count: 411 10*3/uL — ABNORMAL HIGH (ref 150–400)
RBC: 3.56 MIL/uL — ABNORMAL LOW (ref 4.22–5.81)
RDW: 13.4 % (ref 11.5–15.5)
WBC Count: 43 10*3/uL — ABNORMAL HIGH (ref 4.0–10.5)
nRBC: 0 % (ref 0.0–0.2)

## 2023-10-02 MED ORDER — HYDROCODONE-ACETAMINOPHEN 5-325 MG PO TABS
1.0000 | ORAL_TABLET | Freq: Four times a day (QID) | ORAL | 0 refills | Status: DC | PRN
Start: 2023-10-02 — End: 2023-11-25

## 2023-10-02 MED ORDER — ONDANSETRON HCL 8 MG PO TABS: 8.0000 mg | ORAL_TABLET | Freq: Three times a day (TID) | ORAL | 0 refills | Status: DC | PRN

## 2023-10-02 MED ORDER — SENNOSIDES-DOCUSATE SODIUM 8.6-50 MG PO TABS: 2.0000 | ORAL_TABLET | Freq: Every day | ORAL | 1 refills | Status: DC

## 2023-10-05 ENCOUNTER — Ambulatory Visit (HOSPITAL_COMMUNITY): Admission: RE | Admit: 2023-10-05 | Source: Ambulatory Visit

## 2023-10-05 ENCOUNTER — Ambulatory Visit (HOSPITAL_COMMUNITY)
Admission: RE | Admit: 2023-10-05 | Discharge: 2023-10-05 | Disposition: A | Discharge status: Home / Self Care | Source: Ambulatory Visit | Attending: Physician Assistant | Admitting: Physician Assistant

## 2023-10-05 DIAGNOSIS — W3400XS Accidental discharge from unspecified firearms or gun, sequela: Secondary | ICD-10-CM | POA: Insufficient documentation

## 2023-10-05 DIAGNOSIS — S0194XS Puncture wound with foreign body of unspecified part of head, sequela: Secondary | ICD-10-CM | POA: Diagnosis not present

## 2023-10-05 DIAGNOSIS — C801 Malignant (primary) neoplasm, unspecified: Secondary | ICD-10-CM | POA: Diagnosis not present

## 2023-10-05 DIAGNOSIS — I6782 Cerebral ischemia: Secondary | ICD-10-CM | POA: Diagnosis not present

## 2023-10-05 DIAGNOSIS — C778 Secondary and unspecified malignant neoplasm of lymph nodes of multiple regions: Secondary | ICD-10-CM | POA: Insufficient documentation

## 2023-10-05 DIAGNOSIS — R918 Other nonspecific abnormal finding of lung field: Secondary | ICD-10-CM | POA: Insufficient documentation

## 2023-10-05 DIAGNOSIS — C7951 Secondary malignant neoplasm of bone: Secondary | ICD-10-CM | POA: Insufficient documentation

## 2023-10-05 MED ORDER — IOHEXOL 300 MG/ML  SOLN
75.0000 mL | Freq: Once | INTRAMUSCULAR | Status: AC | PRN
  Administered 2023-10-05: 75 mL via INTRAVENOUS

## 2023-10-07 ENCOUNTER — Other Ambulatory Visit: Payer: Self-pay | Admitting: Radiology

## 2023-10-07 ENCOUNTER — Other Ambulatory Visit: Payer: Self-pay | Admitting: Interventional Radiology

## 2023-10-07 DIAGNOSIS — Z01818 Encounter for other preprocedural examination: Secondary | ICD-10-CM
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
## 2023-10-08 ENCOUNTER — Encounter (HOSPITAL_COMMUNITY): Payer: Self-pay

## 2023-10-08 ENCOUNTER — Other Ambulatory Visit: Payer: Self-pay

## 2023-10-08 ENCOUNTER — Ambulatory Visit (HOSPITAL_COMMUNITY)
Admission: RE | Admit: 2023-10-08 | Discharge: 2023-10-08 | Disposition: A | Discharge status: Home / Self Care | Source: Ambulatory Visit | Attending: Physician Assistant | Admitting: Physician Assistant

## 2023-10-08 DIAGNOSIS — F17290 Nicotine dependence, other tobacco product, uncomplicated: Secondary | ICD-10-CM | POA: Diagnosis not present

## 2023-10-08 DIAGNOSIS — H548 Legal blindness, as defined in USA: Secondary | ICD-10-CM | POA: Diagnosis not present

## 2023-10-08 DIAGNOSIS — C778 Secondary and unspecified malignant neoplasm of lymph nodes of multiple regions: Secondary | ICD-10-CM | POA: Insufficient documentation

## 2023-10-08 DIAGNOSIS — J45909 Unspecified asthma, uncomplicated: Secondary | ICD-10-CM | POA: Diagnosis not present

## 2023-10-08 DIAGNOSIS — E039 Hypothyroidism, unspecified: Secondary | ICD-10-CM | POA: Diagnosis not present

## 2023-10-08 DIAGNOSIS — Z8546 Personal history of malignant neoplasm of prostate: Secondary | ICD-10-CM | POA: Diagnosis not present

## 2023-10-08 DIAGNOSIS — R1909 Other intra-abdominal and pelvic swelling, mass and lump: Secondary | ICD-10-CM | POA: Diagnosis not present

## 2023-10-08 DIAGNOSIS — Z01818 Encounter for other preprocedural examination: Secondary | ICD-10-CM | POA: Diagnosis not present

## 2023-10-08 DIAGNOSIS — K4090 Unilateral inguinal hernia, without obstruction or gangrene, not specified as recurrent: Secondary | ICD-10-CM | POA: Insufficient documentation

## 2023-10-08 DIAGNOSIS — C786 Secondary malignant neoplasm of retroperitoneum and peritoneum: Secondary | ICD-10-CM | POA: Diagnosis not present

## 2023-10-08 DIAGNOSIS — F17210 Nicotine dependence, cigarettes, uncomplicated: Secondary | ICD-10-CM | POA: Insufficient documentation

## 2023-10-08 DIAGNOSIS — C801 Malignant (primary) neoplasm, unspecified: Secondary | ICD-10-CM | POA: Diagnosis not present

## 2023-10-08 LAB — PROTIME-INR
INR: 1.3 — ABNORMAL HIGH (ref 0.8–1.2)
Prothrombin Time: 16.7 s — ABNORMAL HIGH (ref 11.4–15.2)

## 2023-10-08 LAB — CBC
HCT: 30 % — ABNORMAL LOW (ref 39.0–52.0)
Hemoglobin: 10.5 g/dL — ABNORMAL LOW (ref 13.0–17.0)
MCH: 32.4 pg (ref 26.0–34.0)
MCHC: 35 g/dL (ref 30.0–36.0)
MCV: 92.6 fL (ref 80.0–100.0)
Platelets: 396 10*3/uL (ref 150–400)
RBC: 3.24 MIL/uL — ABNORMAL LOW (ref 4.22–5.81)
RDW: 13.5 % (ref 11.5–15.5)
WBC: 47.6 10*3/uL — ABNORMAL HIGH (ref 4.0–10.5)
nRBC: 0 % (ref 0.0–0.2)

## 2023-10-08 MED ORDER — HYDROCODONE-ACETAMINOPHEN 5-325 MG PO TABS: 1.0000 | ORAL_TABLET | ORAL | Status: DC | PRN

## 2023-10-08 MED ORDER — SODIUM CHLORIDE 0.9% FLUSH: 3.0000 mL | INTRAVENOUS | Status: DC | PRN

## 2023-10-08 MED ORDER — FENTANYL CITRATE (PF) 100 MCG/2ML IJ SOLN
INTRAMUSCULAR | Status: AC
Start: 2023-10-08 — End: ?
  Filled 2023-10-08: qty 4

## 2023-10-08 MED ORDER — FENTANYL CITRATE (PF) 100 MCG/2ML IJ SOLN
INTRAMUSCULAR | Status: AC | PRN
  Administered 2023-10-08: 25 ug via INTRAVENOUS
  Administered 2023-10-08: 50 ug via INTRAVENOUS

## 2023-10-08 MED ORDER — MIDAZOLAM HCL 2 MG/2ML IJ SOLN
INTRAMUSCULAR | Status: AC
  Filled 2023-10-08: qty 4

## 2023-10-08 MED ORDER — MIDAZOLAM HCL 2 MG/2ML IJ SOLN
INTRAMUSCULAR | Status: AC | PRN
  Administered 2023-10-08: 1 mg via INTRAVENOUS

## 2023-10-08 NOTE — Progress Notes (Incomplete)
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 *** minutes* .  All of the patient's questions were answered with apparent satisfaction. The patient knows to  call the clinic with any problems, questions or concerns.   Wyvonnia Lora MD MS AAHIVMS [facility] Hematology/Oncology Physician [facility]  .*Total Encounter Time as defined by the Centers for Medicare and Medicaid Services includes, in addition to the face-to-face time of a patient visit (documented in the note above) non-face-to-face time: obtaining and reviewing outside history, ordering and reviewing medications, tests or procedures, care coordination (communications with other health care professionals or caregivers) and documentation in the medical record.    I,Mitra Faeizi,acting as a Neurosurgeon for Wyvonnia Lora, MD.,have documented all relevant documentation on the behalf of Wyvonnia Lora, MD,as directed by  Wyvonnia Lora, MD while in the presence of Wyvonnia Lora, MD.   ***

## 2023-10-08 NOTE — Procedures (Signed)
  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 Postprocedure diagnosis: same EBL:    minimal Complications:   none immediate  See full dictation in [employer].  Thora Lance MD Main # [phone] Pager  [phone] Mobile [phone]

## 2023-10-14 LAB — SURGICAL PATHOLOGY

## 2023-10-14 NOTE — Progress Notes (Signed)
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 until he reaches a decision on what he is inclined to do next -discussed that our first priority is to ensure that he is not suffering and not in too much pain throughout this process -our second priority is his personal decision in regards to whether he is inclined to consider treatment or focus on comfort care -continue Hydrocodone up to 2 tablets every 4-6 hours -discussed that if he is needing to use his pain medication very frequently, we may need to switch to long-acting  medication to control his pain -patient's daughter is patient's official heath care proxy -patient and daughter shall provide a copy DNR/DNI form if he signed one and living will -answered al of patient's and his daughter's questions in detail  FOLLOW-UP: ***  The total time spent in the appointment was *** minutes* .  All of the patient's questions were answered with apparent satisfaction. The patient knows to call the clinic with any problems, questions or concerns.   Jacquelyn Matt MD MS  [facility] Hematology/Oncology Physician [facility]  .*Total Encounter Time as defined by the Centers for Medicare and Medicaid Services includes, in addition to the face-to-face time of a patient visit (documented in the note above) non-face-to-face time: obtaining and reviewing outside history, ordering and reviewing medications, tests or procedures, care coordination (communications with other health care professionals or caregivers) and documentation in the medical record.    I,Mitra Faeizi,acting as a Neurosurgeon for Jacquelyn Matt, Massey.,have documented all relevant documentation on the behalf of Jacquelyn Matt, Massey,as directed by  Jacquelyn Matt, Massey while in the presence of Jacquelyn Matt, Massey.  ***

## 2023-10-15 ENCOUNTER — Inpatient Hospital Stay: Attending: Hematology | Admitting: Hematology

## 2023-10-15 VITALS — BP 135/68 | HR 95 | Temp 97.3°F | Resp 16 | Wt 123.6 lb

## 2023-10-15 DIAGNOSIS — D72829 Elevated white blood cell count, unspecified: Secondary | ICD-10-CM | POA: Insufficient documentation

## 2023-10-15 DIAGNOSIS — C61 Malignant neoplasm of prostate: Secondary | ICD-10-CM | POA: Insufficient documentation

## 2023-10-15 DIAGNOSIS — C7951 Secondary malignant neoplasm of bone: Secondary | ICD-10-CM | POA: Diagnosis not present

## 2023-10-15 DIAGNOSIS — Z7189 Other specified counseling: Secondary | ICD-10-CM | POA: Diagnosis not present

## 2023-10-15 DIAGNOSIS — C3490 Malignant neoplasm of unspecified part of unspecified bronchus or lung: Secondary | ICD-10-CM | POA: Diagnosis not present

## 2023-10-19 ENCOUNTER — Other Ambulatory Visit: Payer: Self-pay

## 2023-10-19 DIAGNOSIS — C3490 Malignant neoplasm of unspecified part of unspecified bronchus or lung: Secondary | ICD-10-CM

## 2023-10-19 DIAGNOSIS — C7951 Secondary malignant neoplasm of bone: Secondary | ICD-10-CM

## 2023-10-20 NOTE — Progress Notes (Signed)
 Contacted by daughter Tony Massey to inform Dr Salomon Cree that her father Tony Massey, has decided to pursue hospice care. Dr Epifania Haskell nurse Earline Glenn, RN was informed of this request and entered a referral to [facility]. Will continue to monitor for needs.

## 2023-10-22 ENCOUNTER — Encounter: Payer: Self-pay | Admitting: Physician Assistant

## 2023-10-23 ENCOUNTER — Other Ambulatory Visit: Payer: Self-pay | Admitting: Physician Assistant

## 2023-10-23 MED ORDER — OXYCODONE HCL ER 10 MG PO T12A: 10.0000 mg | EXTENDED_RELEASE_TABLET | Freq: Two times a day (BID) | ORAL | 0 refills | Status: DC

## 2023-12-06 DEATH — deceased
